# Patient Record
Sex: Female | Born: 1990 | Race: White | Hispanic: No | Marital: Single | Smoking: Former smoker
Health system: Southern US, Community
[De-identification: ages and names within clinical notes are randomized; demographics above are authoritative.]

## PROBLEM LIST (undated history)

## (undated) DIAGNOSIS — R519 Headache, unspecified: Secondary | ICD-10-CM

## (undated) HISTORY — PX: SHOULDER SURGERY: SHX246

---
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
## 2014-09-22 ENCOUNTER — Ambulatory Visit: Payer: Self-pay

## 2014-09-22 LAB — URINALYSIS, COMPLETE
Bilirubin,UR: NEGATIVE
Blood: NEGATIVE
GLUCOSE, UR: NEGATIVE
KETONE: NEGATIVE
Nitrite: NEGATIVE
Ph: 6 (ref 5.0–8.0)
Protein: NEGATIVE
SPECIFIC GRAVITY: 1.025 (ref 1.000–1.030)

## 2014-09-24 LAB — URINE CULTURE

## 2015-04-16 DIAGNOSIS — O99820 Streptococcus B carrier state complicating pregnancy: Secondary | ICD-10-CM | POA: Insufficient documentation

## 2015-05-02 DIAGNOSIS — R030 Elevated blood-pressure reading, without diagnosis of hypertension: Secondary | ICD-10-CM | POA: Insufficient documentation

## 2017-10-02 ENCOUNTER — Encounter: Payer: Self-pay | Admitting: *Deleted

## 2017-10-02 ENCOUNTER — Other Ambulatory Visit: Payer: Self-pay

## 2017-10-02 DIAGNOSIS — Z7722 Contact with and (suspected) exposure to environmental tobacco smoke (acute) (chronic): Secondary | ICD-10-CM | POA: Diagnosis not present

## 2017-10-02 DIAGNOSIS — J029 Acute pharyngitis, unspecified: Secondary | ICD-10-CM | POA: Insufficient documentation

## 2017-10-02 NOTE — ED Triage Notes (Signed)
Pt has sore throat for 1.5 weeks.  Pt taking motrin with some relief.  Pt alert.

## 2017-10-03 ENCOUNTER — Emergency Department
Admission: EM | Admit: 2017-10-03 | Discharge: 2017-10-03 | Disposition: A | Discharge status: Home / Self Care | Payer: Medicaid Other | Attending: Emergency Medicine | Admitting: Emergency Medicine

## 2017-10-03 ENCOUNTER — Encounter: Payer: Self-pay | Admitting: Emergency Medicine

## 2017-10-03 DIAGNOSIS — J029 Acute pharyngitis, unspecified: Secondary | ICD-10-CM

## 2017-10-03 LAB — GROUP A STREP BY PCR: Group A Strep by PCR: NOT DETECTED

## 2017-10-03 MED ORDER — DEXAMETHASONE 1 MG/ML PO CONC
10.0000 mg | Freq: Once | ORAL | Status: AC
  Administered 2017-10-03: 10 mg via ORAL
  Filled 2017-10-03: qty 10

## 2017-10-03 MED ORDER — TRAMADOL HCL 50 MG PO TABS: 50.0000 mg | ORAL_TABLET | Freq: Four times a day (QID) | ORAL | 0 refills | Status: DC | PRN

## 2017-10-03 NOTE — Discharge Instructions (Signed)
Please follow up with your primary care physician for further evaluation. Please continue taking ibuprofen and tylenol as needed

## 2017-10-03 NOTE — ED Notes (Signed)
Pt signed esignature. 

## 2017-10-03 NOTE — ED Provider Notes (Signed)
[facility] Emergency Department Provider Note   ____________________________________________   First MD Initiated Contact with Patient 10/03/17 [phone]     (approximate)  I have reviewed the triage vital signs and the nursing notes.   HISTORY  Chief Complaint Sore Throat    HPI Denise Small is a 27 y.o. female who comes into the hospital today with some sore throat.  The patient states that she feels like a tennis ball is in her throat.  She states that her mother is engaged to a Surveyor, miningphysician's assistant and was told that she should come in and get checked out because she might have strep throat or epiglottitis.  She states that she did not want the swelling to get so bad that she could not breathe overnight.  She reports that she has been taking ibuprofen to help with any inflammation.  She states that her pain is a 4 out of 10 in intensity but it is worse whenever she tries to swallow.  She states that it got really bad yesterday.  She had a fever about 3 or 4 days ago but reports that she has not had anything since then.  She has been battling a bad cold and reports that she is been having a cough and sometimes has some posttussive emesis.  The patient decided to come into the hospital today for evaluation.  She has been taking Tylenol and ibuprofen at home.   History reviewed. No pertinent past medical history.  There are no active problems to display for this patient.   Past Surgical History:  Procedure Laterality Date  . SHOULDER SURGERY      Prior to Admission medications   Medication Sig Start Date End Date Taking? Authorizing Provider  traMADol (ULTRAM) 50 MG tablet Take 1 tablet (50 mg total) by mouth every 6 (six) hours as needed. 10/03/17   Rebecka ApleyWebster, Geneal Huebert P, MD    Allergies Sulfa antibiotics  No family history on file.  Social History Social History   Tobacco Use  . Smoking status: Passive Smoke Exposure - Never Smoker  .  Smokeless tobacco: Never Used  Substance Use Topics  . Alcohol use: No    Frequency: Never  . Drug use: No    Review of Systems  Constitutional: fever Eyes: No visual changes. ENT:  sore throat. Cardiovascular: Denies chest pain. Respiratory: cough and shortness of breath. Gastrointestinal: No abdominal pain.  No nausea, no vomiting.  No diarrhea.  No constipation. Genitourinary: Negative for dysuria. Musculoskeletal: Negative for back pain. Skin: Negative for rash. Neurological: Negative for headaches, focal weakness or numbness.   ____________________________________________   PHYSICAL EXAM:  VITAL SIGNS: ED Triage Vitals  Enc Vitals Group     BP 10/02/17 2145 127/67     Pulse Rate 10/02/17 2145 85     Resp 10/02/17 2145 18     Temp 10/02/17 2145 98.6 F (37 C)     Temp Source 10/02/17 2145 Oral     SpO2 10/02/17 2145 99 %     Weight 10/02/17 2146 250 lb (113.4 kg)     Height 10/02/17 2146 6' (1.829 m)     Head Circumference --      Peak Flow --      Pain Score 10/02/17 2144 4     Pain Loc --      Pain Edu? --      Excl. in GC? --     Constitutional: Alert and oriented. Well appearing and  in mild distress. Eyes: Conjunctivae are normal. PERRL. EOMI. Head: Atraumatic. Nose: No congestion/rhinnorhea. Mouth/Throat: Mucous membranes are moist.  Oropharynx non-erythematous.  There is some drainage and purulence to the patient's tonsils bilaterally Hematological/Lymphatic/Immunilogical:  cervical lymphadenopathy. Cardiovascular: Normal rate, regular rhythm. Grossly normal heart sounds.  Good peripheral circulation. Respiratory: Normal respiratory effort.  No retractions. Lungs CTAB. Gastrointestinal: Soft and nontender. No distention.  Musculoskeletal: No lower extremity tenderness nor edema.  Neurologic:  Normal speech and language.  Skin:  Skin is warm, dry and intact.  Psychiatric: Mood and affect are normal.   ____________________________________________     LABS (all labs ordered are listed, but only abnormal results are displayed)  Labs Reviewed  GROUP A STREP BY PCR   ____________________________________________  EKG  none ____________________________________________  RADIOLOGY  No results found.  ____________________________________________   PROCEDURES  Procedure(s) performed: None  Procedures  Critical Care performed: No  ____________________________________________   INITIAL IMPRESSION / ASSESSMENT AND PLAN / ED COURSE  As part of my medical decision making, I reviewed the following data within the electronic MEDICAL RECORD NUMBER Notes from prior ED visits and [place] Controlled Substance Database   This is a 27 year old female who comes into the hospital today with some sore throat.  She feels like something is stuck in her throat.  My differential diagnosis includes pharyngitis, strep throat, peritonsillar abscess.  The patient does not have any stridor and is breathing comfortably.  She is not drooling so epiglottitis is lower on my list.  Give the patient a dose of dexamethasone.  I will also check a strep.  The patient will be reassessed once I received the results of her strep.    The patient strep is negative.  She will be discharged home to follow-up with her primary care physician.  ____________________________________________   FINAL CLINICAL IMPRESSION(S) / ED DIAGNOSES  Final diagnoses:  Pharyngitis, unspecified etiology  Sore throat     ED Discharge Orders        Ordered    traMADol (ULTRAM) 50 MG tablet  Every 6 hours PRN     10/03/17 0157       Note:  This document was prepared using Dragon voice recognition software and may include unintentional dictation errors.    Rebecka Apley, MD 10/03/17 [phone]

## 2018-10-31 ENCOUNTER — Encounter: Payer: Self-pay | Admitting: Orthopaedic Surgery

## 2018-10-31 ENCOUNTER — Ambulatory Visit: Payer: Medicaid Other | Admitting: Orthopaedic Surgery

## 2018-10-31 ENCOUNTER — Ambulatory Visit (INDEPENDENT_AMBULATORY_CARE_PROVIDER_SITE_OTHER): Payer: Medicaid Other

## 2018-10-31 VITALS — BP 128/85 | HR 77 | Ht 72.0 in | Wt 242.0 lb

## 2018-10-31 DIAGNOSIS — G8929 Other chronic pain: Secondary | ICD-10-CM

## 2018-10-31 DIAGNOSIS — M25512 Pain in left shoulder: Secondary | ICD-10-CM

## 2018-10-31 MED ORDER — NAPROXEN 500 MG PO TABS: 500.0000 mg | ORAL_TABLET | Freq: Two times a day (BID) | ORAL | 5 refills | Status: DC

## 2018-10-31 NOTE — Patient Instructions (Signed)
..  Your MRI has been ordered.  We will contact your insurance company for approval. After the authorization is received the MRI and a return appointment will be scheduled with you by phone.  If you do not hear from us within 5 business days please call [phone] and ask for the pre-authorization representative in our office.  
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
## 2018-11-09 ENCOUNTER — Ambulatory Visit (HOSPITAL_COMMUNITY)
Admission: RE | Admit: 2018-11-09 | Discharge: 2018-11-09 | Disposition: A | Discharge status: Home / Self Care | Payer: Medicaid Other | Source: Ambulatory Visit | Attending: Orthopaedic Surgery | Admitting: Orthopaedic Surgery

## 2018-11-09 DIAGNOSIS — G8929 Other chronic pain: Secondary | ICD-10-CM | POA: Diagnosis present

## 2018-11-09 DIAGNOSIS — M25512 Pain in left shoulder: Secondary | ICD-10-CM | POA: Insufficient documentation

## 2018-11-13 ENCOUNTER — Ambulatory Visit: Payer: Medicaid Other | Admitting: Orthopaedic Surgery

## 2018-11-13 ENCOUNTER — Telehealth: Payer: Self-pay | Admitting: Orthopaedic Surgery

## 2018-11-13 ENCOUNTER — Encounter: Payer: Self-pay | Admitting: Orthopaedic Surgery

## 2018-11-13 VITALS — BP 129/76 | HR 72 | Ht 72.0 in | Wt 243.0 lb

## 2018-11-13 DIAGNOSIS — M25512 Pain in left shoulder: Secondary | ICD-10-CM

## 2018-11-13 DIAGNOSIS — G8929 Other chronic pain: Secondary | ICD-10-CM

## 2018-11-13 NOTE — Telephone Encounter (Signed)
Drenda Freeze from [facility] [phone], Option 1, then Option 3 - called to verify if patient's imaging is for MRI with and without contrast or is it an arthrogram?

## 2018-11-13 NOTE — Addendum Note (Signed)
Addended by: Baird Kay on: 11/13/2018 04:05 PM   Modules accepted: Orders

## 2018-11-13 NOTE — Progress Notes (Signed)
Patient IH:[id number] Denise Small, female DOB:03/24/1991, 28 y.o. [id number]  Chief Complaint  Patient presents with  . Shoulder Pain    left  . Results    review MRI scan left shoulder     HPI  Denise Small is a 28 y.o. female who has continued pain in the left shoulder.  She had a MRI which showed: IMPRESSION: 1. 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 This may be better evaluated with dedicated MR arthrography.  I explained the findings to her.  She would like to get the arthrogram and then, if surgery needed, proceed with that.  I will arrange the new study.   Body mass index is 32.96 kg/m.  ROS  Review of Systems  Constitutional: Positive for activity change.  Musculoskeletal: Positive for arthralgias.  All other systems reviewed and are negative.   All other systems reviewed and are negative.  The following is a summary of the past history medically, past history surgically, known current medicines, social history and family history.  This information is gathered electronically by the computer from prior information and documentation.  I review this each visit and have found including this information at this point in the chart is beneficial and informative.    History reviewed. No pertinent past medical history.  Past Surgical History:  Procedure Laterality Date  . SHOULDER SURGERY      Family History  Problem Relation Age of Onset  . Arthritis Mother   . Arthritis Father     Social History Social History   Tobacco Use  . Smoking status: Passive Smoke Exposure - Never Smoker  . Smokeless tobacco: Never Used  Substance Use Topics  . Alcohol use: No    Frequency: Never  . Drug use: No    Allergies  Allergen Reactions  . Sulfa Antibiotics Rash    Current Outpatient Medications  Medication Sig Dispense Refill  . naproxen (NAPROSYN) 500 MG tablet Take 1 tablet (500 mg total) by  mouth 2 (two) times daily with a meal. 60 tablet 5  . Norethindrone-Ethinyl Estradiol-Fe (GENERESS FE) 0.8-25 MG-MCG tablet Chew by mouth.    . traMADol (ULTRAM) 50 MG tablet Take 1 tablet (50 mg total) by mouth every 6 (six) hours as needed. (Patient not taking: Reported on 11/13/2018) 12 tablet 0   No current facility-administered medications for this visit.      Physical Exam  Blood pressure 129/76, pulse 72, height 6' (1.829 m), weight 243 lb (110.2 kg).  Constitutional: overall normal hygiene, normal nutrition, well developed, normal grooming, normal body habitus. Assistive device:none  Musculoskeletal: gait and station Limp none, muscle tone and strength are normal, no tremors or atrophy is present.  .  Neurological: coordination overall normal.  Deep tendon reflex/nerve stretch intact.  Sensation normal.  Cranial nerves II-XII intact.   Skin:   Normal overall no scars, lesions, ulcers or rashes. No psoriasis.  Psychiatric: Alert and oriented x 3.  Recent memory intact, remote memory unclear.  Normal mood and affect. Well groomed.  Good eye contact.  Cardiovascular: overall no swelling, no varicosities, no edema bilaterally, normal temperatures of the legs and arms, no clubbing, cyanosis and good capillary refill.  Lymphatic: palpation is normal.  She has painful ROM of the right shoulder, more in the extremes.  NV intact. All other systems reviewed and are negative   The patient has been educated about the nature of the problem(s) and counseled on treatment options.  The patient appeared to understand what I have discussed and is in agreement with it.  Encounter Diagnosis  Name Primary?  . Chronic left shoulder pain Yes    PLAN Call if any problems.  Precautions discussed.  Continue current medications.   Return to clinic get MRI arthrogram of the left shoulder.   Electronically Signed Darreld Mclean, MD 2/25/202010:47 AM

## 2018-11-23 ENCOUNTER — Ambulatory Visit
Admission: RE | Admit: 2018-11-23 | Discharge: 2018-11-23 | Disposition: A | Discharge status: Home / Self Care | Payer: Medicaid Other | Source: Ambulatory Visit | Attending: Orthopaedic Surgery | Admitting: Orthopaedic Surgery

## 2018-11-23 DIAGNOSIS — M25512 Pain in left shoulder: Principal | ICD-10-CM

## 2018-11-23 DIAGNOSIS — G8929 Other chronic pain: Secondary | ICD-10-CM

## 2018-11-23 MED ORDER — IOPAMIDOL (ISOVUE-M 200) INJECTION 41%
20.0000 mL | Freq: Once | INTRAMUSCULAR | Status: AC
  Administered 2018-11-23: 20 mL via INTRA_ARTICULAR

## 2018-11-29 ENCOUNTER — Other Ambulatory Visit: Payer: Self-pay

## 2018-11-29 ENCOUNTER — Ambulatory Visit (INDEPENDENT_AMBULATORY_CARE_PROVIDER_SITE_OTHER): Payer: Medicaid Other | Admitting: Orthopaedic Surgery

## 2018-11-29 ENCOUNTER — Other Ambulatory Visit: Payer: Medicaid Other

## 2018-11-29 ENCOUNTER — Encounter: Payer: Self-pay | Admitting: Orthopaedic Surgery

## 2018-11-29 VITALS — BP 122/71 | HR 61 | Ht 72.0 in | Wt 238.0 lb

## 2018-11-29 DIAGNOSIS — G8929 Other chronic pain: Secondary | ICD-10-CM

## 2018-11-29 DIAGNOSIS — M25512 Pain in left shoulder: Secondary | ICD-10-CM

## 2018-11-29 NOTE — Progress Notes (Signed)
Patient ON:GEXBMW Denise Small, female DOB:Apr 01, 1991, 28 y.o. UXL:[id number]  Chief Complaint  Patient presents with  . Shoulder Pain    left     HPI  Denise Small is a 28 y.o. female who has left shoulder pain.  She had a MRI and an arthrogram was recommended of the shoulder. She had that done and it showed: IMPRESSION: 1. Prior anterior labral repair. No recurrent labral tear. The patient has a Buford complex and the fluid signal seen on prior MRI corresponded to normal joint fluid undercutting a thickened middle glenohumeral ligament.    Body mass index is 32.28 kg/m.  ROS  Review of Systems  Constitutional: Positive for activity change.  Musculoskeletal: Positive for arthralgias.  All other systems reviewed and are negative.   All other systems reviewed and are negative.  The following is a summary of the past history medically, past history surgically, known current medicines, social history and family history.  This information is gathered electronically by the computer from prior information and documentation.  I review this each visit and have found including this information at this point in the chart is beneficial and informative.    History reviewed. No pertinent past medical history.  Past Surgical History:  Procedure Laterality Date  . SHOULDER SURGERY      Family History  Problem Relation Age of Onset  . Arthritis Mother   . Arthritis Father     Social History Social History   Tobacco Use  . Smoking status: Passive Smoke Exposure - Never Smoker  . Smokeless tobacco: Never Used  Substance Use Topics  . Alcohol use: No    Frequency: Never  . Drug use: No    Allergies  Allergen Reactions  . Sulfa Antibiotics Rash    Current Outpatient Medications  Medication Sig Dispense Refill  . naproxen (NAPROSYN) 500 MG tablet Take 1 tablet (500 mg total) by mouth 2 (two) times daily with a meal. 60 tablet 5  . Norethindrone-Ethinyl  Estradiol-Fe (GENERESS FE) 0.8-25 MG-MCG tablet Chew by mouth.    . traMADol (ULTRAM) 50 MG tablet Take 1 tablet (50 mg total) by mouth every 6 (six) hours as needed. (Patient not taking: Reported on 11/13/2018) 12 tablet 0   No current facility-administered medications for this visit.      Physical Exam  Blood pressure 122/71, pulse 61, height 6' (1.829 m), weight 238 lb (108 kg), last menstrual period 11/04/2018.  Constitutional: overall normal hygiene, normal nutrition, well developed, normal grooming, normal body habitus. Assistive device:none  Musculoskeletal: gait and station Limp none, muscle tone and strength are normal, no tremors or atrophy is present.  .  Neurological: coordination overall normal.  Deep tendon reflex/nerve stretch intact.  Sensation normal.  Cranial nerves II-XII intact.   Skin:   Normal overall no scars, lesions, ulcers or rashes. No psoriasis.  Psychiatric: Alert and oriented x 3.  Recent memory intact, remote memory unclear.  Normal mood and affect. Well groomed.  Good eye contact.  Cardiovascular: overall no swelling, no varicosities, no edema bilaterally, normal temperatures of the legs and arms, no clubbing, cyanosis and good capillary refill.  Lymphatic: palpation is normal.  Left shoulder has near full motion but painful in the extremes.  All other systems reviewed and are negative   The patient has been educated about the nature of the problem(s) and counseled on treatment options.  The patient appeared to understand what I have discussed and is in agreement with it.  Encounter Diagnosis  Name Primary?  . Chronic left shoulder pain Yes    PLAN Call if any problems.  Precautions discussed.  Continue current medications.   Return to clinic 1 month   Do exercises. Go by PT to get exercises also.  Electronically Signed Darreld Mclean, MD 3/12/202010:08 AM

## 2018-11-29 NOTE — Addendum Note (Signed)
Addended by: Baird Kay on: 11/29/2018 10:26 AM   Modules accepted: Orders

## 2018-12-03 ENCOUNTER — Telehealth (HOSPITAL_COMMUNITY): Payer: Self-pay | Admitting: Medical Oncology

## 2018-12-03 NOTE — Telephone Encounter (Signed)
12/03/18  Spoke with patient about changing appt on 3/18 with Beth to either 9:00 or 2:30.  She said that she would have to talk to her boss and call us back.

## 2018-12-05 ENCOUNTER — Ambulatory Visit (HOSPITAL_COMMUNITY): Payer: Medicaid Other | Attending: Orthopaedic Surgery | Admitting: Specialist

## 2018-12-05 ENCOUNTER — Encounter (HOSPITAL_COMMUNITY): Payer: Self-pay | Admitting: Specialist

## 2018-12-05 ENCOUNTER — Other Ambulatory Visit: Payer: Self-pay

## 2018-12-05 DIAGNOSIS — R29898 Other symptoms and signs involving the musculoskeletal system: Secondary | ICD-10-CM

## 2018-12-05 DIAGNOSIS — M25512 Pain in left shoulder: Secondary | ICD-10-CM | POA: Insufficient documentation

## 2018-12-05 DIAGNOSIS — G8929 Other chronic pain: Secondary | ICD-10-CM | POA: Diagnosis present

## 2018-12-05 DIAGNOSIS — M25612 Stiffness of left shoulder, not elsewhere classified: Secondary | ICD-10-CM | POA: Diagnosis present

## 2018-12-05 NOTE — Patient Instructions (Signed)

## 2018-12-05 NOTE — Therapy (Signed)
[facility] [address] [place], [place], [address] Phone: [phone]   Fax:  [phone]  Occupational Therapy Evaluation  Patient Details  Name: Denise Small MRN: [id number] Date of Birth: 29-Aug-1991 Referring Provider (OT): Dr. Darreld Mclean   Encounter Date: 12/05/2018  OT End of Session - 12/05/18 1547    Visit Number  1    Number of Visits  12    Date for OT Re-Evaluation  01/16/19   mini reassess on 4/8   Authorization Type  medicaid    Authorization Time Period  requested 3 visits from 12/06/18-12/24/2018    Authorization - Visit Number  0    Authorization - Number of Visits  3    OT Start Time  1430    OT Stop Time  1515    OT Time Calculation (min)  45 min    Activity Tolerance  Patient tolerated treatment well    Behavior During Therapy  [facility] for tasks assessed/performed       History reviewed. No pertinent past medical history.  Past Surgical History:  Procedure Laterality Date  . SHOULDER SURGERY      There were no vitals filed for this visit.  Subjective Assessment - 12/05/18 1543    Subjective   S:  I had surgery on my shoulder 7 years ago, it hasnt ever been right since.      Pertinent History  Denise Small reports ongoing left shoulder pain, which initially began 7 years ago with a dislocation that was never corrected.  She had surgery 7 years ago, and reports she never finished her therapy and has experienced pain ever since.  She has a 29 year old son and reports that she is having more and more pain and less mobilty in her left shoulder when she picks him up.  She consulted with Dr. Hilda Lias, had an arthrogram completed, which detected no new tears.  She has been referred to occupational therapy for evaluation and treatment for left shoulder pain.      Patient Stated Goals  I want to use my left arm like normal.    Currently in Pain?  Yes    Pain Score  5     Pain Location  Shoulder    Pain Orientation   Left    Pain Descriptors / Indicators  Aching    Pain Type  Acute pain    Pain Onset  More than a month ago    Pain Frequency  Constant    Aggravating Factors   use, movement    Pain Relieving Factors  nothing    Effect of Pain on Daily Activities  max        [facility] OT Assessment - 12/05/18 0001      Assessment   Medical Diagnosis  Left Shoulder Pain    Referring Provider (OT)  Dr. Darreld Mclean    Onset Date/Surgical Date  --   chronic   Hand Dominance  Right    Prior Therapy  7 years ago after surgery on left shoulder      Precautions   Precautions  None      Restrictions   Weight Bearing Restrictions  No      Balance Screen   Has the patient fallen in the past 6 months  No    Has the patient had a decrease in activity level because of a fear of falling?   No    Is the patient reluctant  to leave their home because of a fear of falling?   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  pain with reaching out to side, overhead, behind head, behind back, very painful when attempting to pick up her 59 year old son.       Written Expression   Dominant Hand  Right      Observation/Other Assessments   Focus on Therapeutic Outcomes (FOTO)   n/a      Posture/Postural Control   Posture/Postural Control  Postural limitations    Postural Limitations  Rounded Shoulders      Sensation   Light Touch  Appears Intact      Coordination   Gross Motor Movements are Fluid and Coordinated  Yes    Fine Motor Movements are Fluid and Coordinated  Yes      ROM / Strength   AROM / PROM / Strength   AROM;PROM;Strength      Palpation   Palpation comment  moderate fascial restrictions in left scapular and shoulder region      AROM   Overall AROM Comments  assessed in seated.  external and internal rotation with shoulder adducted    AROM Assessment Site  Shoulder    Right/Left Shoulder  Left    Left Shoulder Flexion  110 Degrees    Left Shoulder ABduction  110 Degrees    Left Shoulder Internal Rotation  70 Degrees    Left Shoulder External Rotation  46 Degrees      PROM   Overall PROM Comments  assessed in supine, WFL      Strength   Overall Strength Comments  assessed in seated    Strength Assessment Site  Shoulder    Right/Left Shoulder  Left    Left Shoulder Flexion  4+/5    Left Shoulder ABduction  4+/5    Left Shoulder Internal Rotation  4+/5    Left Shoulder External Rotation  4+/5               OT Treatments/Exercises (OP) - 12/05/18 0001      Manual Therapy   Manual Therapy  Myofascial release    Manual therapy comments  manual therapy completed seperately than all other intervention this date of service    Myofascial Release  myofascial release and manual stretching to left upper arm, scapular, and shoulder region to decrease pain and restrictions and improve pain free mobilty in left shoulder region.            OT Education - 12/05/18 1546    Education Details  educated patient on shoulder stretches to improve pain free mobility and strength    Person(s) Educated  Patient    Methods  Explanation;Demonstration;Handout    Comprehension  Verbalized understanding;Returned demonstration       OT Short Term Goals - 12/05/18 1557      OT SHORT TERM GOAL #1   Title  Patient will be independent with HEP for improved mobility  and strength in left shoulder needed for daily activity completion.     Time  6    Period  Weeks    Status  New    Target Date  01/16/19      OT SHORT TERM GOAL #2   Title  Patient will improve left shoulder A/ROM and P/ROM  to WNL for improved ability to reach into overhead cabinets.    Time  6    Period  Weeks    Status  New      OT SHORT TERM GOAL #3   Title  Patient will improve left shoulder strength to 5/5 for improved ability to pick up and carry her son.    Time  6    Period  Weeks    Status  New      OT SHORT TERM GOAL #4   Title  Patient will improve left shoulder pain to a 2/10 or better when completing daily activities.     Time  6    Period  Weeks    Status  New      OT SHORT TERM GOAL #5   Title  Patient will decrease fascial restrictions in her left shoulder region from moderate to minimal or better for improved pain free mobility required for ADL completion.      Time  6    Period  Weeks    Status  New      Additional Short Term Goals   Additional Short Term Goals  Yes      OT SHORT TERM GOAL #6   Title  Patient will improve posture from fair + to good for less pain in left shoulder region when completing dialy activities.     Time  6    Period  Weeks    Status  New               Plan - 12/05/18 1548    Clinical Impression Statement  A:  Patient is a 28 year old female with chronic left shoulder pain due to surgically repaired left labral tear 7 years ago.  As her son has gotten bigger (age 64), she has been experiencing increased pain with range of motion and and strength activities with her left shoulder.  Patient reports that unless she keeps her left arm to her side, she experiences pain with all functional activity.      OT Occupational Profile and History  Problem Focused Assessment - Including review of records relating to presenting problem    Occupational performance deficits (Please refer to evaluation for details):  ADL's;IADL's;Work;Leisure    Body Structure / Function / Physical Skills  ADL;Muscle spasms;ROM;Strength;IADL;Pain;UE functional use;Fascial restriction    Rehab Potential  Good    Clinical Decision Making  Limited treatment options, no task  modification necessary    Comorbidities Affecting Occupational Performance:  None    Modification or Assistance to Complete Evaluation   No modification of tasks or assist necessary to complete eval    OT Frequency  2x / week    OT Duration  6 weeks    OT Treatment/Interventions  Self-care/ADL training;Cryotherapy;Therapeutic exercise;DME and/or AE instruction;Manual Therapy;Neuromuscular education;Ultrasound;Electrical Stimulation;Energy conservation;Therapeutic activities;Passive range of motion;Patient/family education    Plan  P: SKilled OT intervention 2 times a week for 6 weeks to decrease pain and fascial restrictions and improve pain free mobility and strenght in her left shoulder while completing b/ials, work, leisure activities, and caring for her 46 year old son.  Next session:  begin manual therapy, a/rom, progress as tolerated.  add tband exercises to hep.         Patient will benefit from skilled therapeutic intervention in order to improve the following deficits and impairments:  Body Structure / Function / Physical Skills  Visit Diagnosis: Chronic left shoulder pain - Plan: Ot plan of care cert/re-cert  Stiffness of left shoulder, not elsewhere classified - Plan: Ot plan of care cert/re-cert  Other symptoms and signs involving the musculoskeletal system - Plan: Ot plan of care cert/re-cert    Problem List Patient Active Problem List   Diagnosis Date Noted  . Postpartum care following vaginal delivery 05/04/2015  . Elevated blood pressure reading 05/02/2015  . Group B Streptococcus carrier, antepartum 04/16/2015  . Vaginal delivery 11/26/2014    Shirlean Mylar, MHA, OTR/L [phone]  12/05/2018, 4:10 PM  [facility] [address] [place], [place], [address] Phone: [phone]   Fax:  [phone]  Name: Denise Small MRN: [id number] Date of Birth: [birth year]

## 2018-12-07 ENCOUNTER — Telehealth (HOSPITAL_COMMUNITY): Payer: Self-pay | Admitting: Occupational Therapy

## 2018-12-07 NOTE — Telephone Encounter (Signed)
Called and left message for pt regarding 2 week clinic closure for COVID-19 precautions. Asked pt to return call for HEP update and rescheduling appts.   Ezra Sites, OTR/L  [phone] 12/07/2018

## 2018-12-10 ENCOUNTER — Telehealth (HOSPITAL_COMMUNITY): Payer: Self-pay

## 2018-12-10 NOTE — Telephone Encounter (Signed)
Spoke with patient regarding 2 week closure. Rescheduled appointment for 12/24/18. E-mail provided to send updated HEP.   Limmie Patricia, OTR/L,CBIS  [phone];

## 2018-12-12 ENCOUNTER — Encounter (HOSPITAL_COMMUNITY): Payer: Medicaid Other

## 2018-12-18 ENCOUNTER — Encounter

## 2018-12-21 ENCOUNTER — Telehealth (HOSPITAL_COMMUNITY): Payer: Self-pay | Admitting: Occupational Therapy

## 2018-12-21 NOTE — Telephone Encounter (Signed)
Patient was contacted today regarding the temporary reduction of OP rehab services due to concerns for community transmission of Covid-19.  Therapist advised the patient to continue to perform their HEP and assured they had no unanswered questions at this time.   Will call to reschedule once clinic has reopened.    Ezra Sites, OTR/L  [phone] 12/21/2018

## 2018-12-24 ENCOUNTER — Ambulatory Visit (HOSPITAL_COMMUNITY): Payer: Medicaid Other

## 2018-12-26 ENCOUNTER — Other Ambulatory Visit: Payer: Self-pay

## 2018-12-26 ENCOUNTER — Ambulatory Visit: Payer: Medicaid Other | Admitting: Orthopaedic Surgery

## 2018-12-26 ENCOUNTER — Encounter: Payer: Self-pay | Admitting: Orthopaedic Surgery

## 2018-12-26 VITALS — BP 125/82 | HR 70 | Ht 72.0 in | Wt 244.0 lb

## 2018-12-26 DIAGNOSIS — M25512 Pain in left shoulder: Secondary | ICD-10-CM | POA: Diagnosis not present

## 2018-12-26 DIAGNOSIS — G8929 Other chronic pain: Secondary | ICD-10-CM | POA: Diagnosis not present

## 2018-12-26 NOTE — Progress Notes (Signed)
Patient ZO:XWRUEA:Denise Small, female DOB:[birth year], 28 y.o. VWU:[id number]  Chief Complaint  Patient presents with  . Shoulder Pain    HPI  Denise Small is a 28 y.o. female who has chronic shoulder pain left.  She has had prior surgery on the shoulder and recently had a MRI with arthrogram.  She has been doing exercises as recommended by OT but OT is closed secondary to the COVID-19 problem.  She says the pain is unchanged.  She has no new trauma, she has no numbness.  The Naprosyn did not help.  I will have her see Dr. August Saucerean about this.  She understands there may be a delay because of the COVID-19 problem.   Body mass index is 33.09 kg/m.  ROS  Review of Systems  Constitutional: Positive for activity change.  Musculoskeletal: Positive for arthralgias.  All other systems reviewed and are negative.   All other systems reviewed and are negative.  The following is a summary of the past history medically, past history surgically, known current medicines, social history and family history.  This information is gathered electronically by the computer from prior information and documentation.  I review this each visit and have found including this information at this point in the chart is beneficial and informative.    History reviewed. No pertinent past medical history.  Past Surgical History:  Procedure Laterality Date  . SHOULDER SURGERY      Family History  Problem Relation Age of Onset  . Arthritis Mother   . Arthritis Father     Social History Social History   Tobacco Use  . Smoking status: Passive Smoke Exposure - Never Smoker  . Smokeless tobacco: Never Used  Substance Use Topics  . Alcohol use: No    Frequency: Never  . Drug use: No    Allergies  Allergen Reactions  . Sulfa Antibiotics Rash    Current Outpatient Medications  Medication Sig Dispense Refill  . naproxen (NAPROSYN) 500 MG tablet Take 1 tablet (500 mg total) by mouth 2 (two)  times daily with a meal. 60 tablet 5  . Norethindrone-Ethinyl Estradiol-Fe (GENERESS FE) 0.8-25 MG-MCG tablet Chew by mouth.    . traMADol (ULTRAM) 50 MG tablet Take 1 tablet (50 mg total) by mouth every 6 (six) hours as needed. (Patient not taking: Reported on 11/13/2018) 12 tablet 0   No current facility-administered medications for this visit.      Physical Exam  Blood pressure 125/82, pulse 70, height 6' (1.829 m), weight 244 lb (110.7 kg).  Constitutional: overall normal hygiene, normal nutrition, well developed, normal grooming, normal body habitus. Assistive device:none  Musculoskeletal: gait and station Limp none, muscle tone and strength are normal, no tremors or atrophy is present.  .  Neurological: coordination overall normal.  Deep tendon reflex/nerve stretch intact.  Sensation normal.  Cranial nerves II-XII intact.   Skin:   Normal overall no scars, lesions, ulcers or rashes. No psoriasis.  Psychiatric: Alert and oriented x 3.  Recent memory intact, remote memory unclear.  Normal mood and affect. Well groomed.  Good eye contact.  Cardiovascular: overall no swelling, no varicosities, no edema bilaterally, normal temperatures of the legs and arms, no clubbing, cyanosis and good capillary refill.  Lymphatic: palpation is normal.  Left shoulder is painful and tender in the extremes but has a good motion.  NV intact.  All other systems reviewed and are negative   The patient has been educated about the nature of the problem(s) and  counseled on treatment options.  The patient appeared to understand what I have discussed and is in agreement with it.  Encounter Diagnosis  Name Primary?  . Chronic left shoulder pain Yes    PLAN Call if any problems.  Precautions discussed.  Continue current medications.   Return to clinic to see Dr. August Saucer   Electronically Signed Darreld Mclean, MD 4/8/20209:25 AM

## 2018-12-27 ENCOUNTER — Ambulatory Visit: Payer: Medicaid Other | Admitting: Orthopaedic Surgery

## 2019-01-02 ENCOUNTER — Other Ambulatory Visit: Payer: Self-pay

## 2019-01-02 ENCOUNTER — Encounter (INDEPENDENT_AMBULATORY_CARE_PROVIDER_SITE_OTHER): Payer: Self-pay | Admitting: Orthopedic Surgery

## 2019-01-02 ENCOUNTER — Ambulatory Visit (INDEPENDENT_AMBULATORY_CARE_PROVIDER_SITE_OTHER): Payer: Medicaid Other | Admitting: Orthopedic Surgery

## 2019-01-02 DIAGNOSIS — M25512 Pain in left shoulder: Secondary | ICD-10-CM

## 2019-01-02 DIAGNOSIS — G8929 Other chronic pain: Secondary | ICD-10-CM | POA: Diagnosis not present
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
## 2019-01-16 ENCOUNTER — Telehealth (INDEPENDENT_AMBULATORY_CARE_PROVIDER_SITE_OTHER): Payer: Self-pay | Admitting: Orthopedic Surgery

## 2019-01-16 NOTE — Telephone Encounter (Signed)
Pt called stating she has the papers Dr August Saucer requested her to get.  Pt would like to know if she needs to drop them off or make an apt to be seen.

## 2019-01-16 NOTE — Telephone Encounter (Signed)
IC s/w patient She will drop off medical records and MRI from previous surgery for Dr August Saucer to review and he will contact patient.

## 2019-01-25 ENCOUNTER — Encounter (HOSPITAL_COMMUNITY): Payer: Self-pay | Admitting: Occupational Therapy

## 2019-01-25 NOTE — Therapy (Signed)
[facility] [place], [place], [address] Phone: [phone]   Fax:  [phone]  Patient Details  Name: Denise Small MRN: [id number] Date of Birth: [birth year] Referring Provider:  No ref. provider found  Encounter Date: 01/25/2019  OCCUPATIONAL THERAPY DISCHARGE SUMMARY  Visits from Start of Care: 1  Current functional level related to goals / functional outcomes: Spoke with pt to follow up on shoulder functioning. Dr. Luna Glasgow has referred pt to Dr. Marlou Sa who is reviewing her records and determining if she is surgical candidate. We will discharge pt at this time as she has had a change in provider and is waiting to determine further procedures. Pt is aware she will need a new referral to return to therapy.    Remaining deficits: Pain, ROM and strength limitations   Education / Equipment: HEP Plan: Patient agrees to discharge.  Patient goals were not met. Patient is being discharged due to a change in medical status.  ?????       Guadelupe Sabin, OTR/L  [phone] 01/25/2019, 3:46 PM  [facility] [address] [place], [place], [address] Phone: [phone]   Fax:  [phone]

## 2019-01-28 ENCOUNTER — Encounter (INDEPENDENT_AMBULATORY_CARE_PROVIDER_SITE_OTHER): Payer: Self-pay | Admitting: Orthopedic Surgery

## 2019-01-28 NOTE — Progress Notes (Signed)
I just talk with Denise Small.  I reviewed her office notes op note and arthroscopic photos.  It appears that she has a anchor in the mid equator region of the glenoid anteriorly.  The rest of her glenohumeral articular surfaces appear intact.  She has had some popping in that shoulder since surgery.  The popping is becoming more severe over the past year but it is been present for the 7-year since surgery.  Pain is starting to radiate up into her neck.  On my exam last clinic visit I thought there was a difference in motion between the left and right shoulder.  She did have a fairly significant injury but I do not get a clear history of actual shoulder instability prior to her surgery.  The op note demonstrated some instability present but no real pathology of the anterior inferior glenohumeral labral complex.  On the operating surgeons preoperative assessment and MRI evaluation it appears that there was mostly concerned about posterior labral tear and possibly anterior labral tear.  I discussed with Denise Small at length about potential evaluation and treatment of this problem.  Primarily that would include at the least release of some of that middle glenohumeral ligament from the capsule attachment at the glenoid.  At the most if there is some labral pathology present superiorly we would do a biceps tenodesis.  Patient understands the risk and benefits associated with the surgical approach in which the water is somewhat muddy in terms of clear-cut indication.  I think based on her worsening symptoms that an arthroscopic evaluation is indicated particularly with the cytocide difference in motion.  Also patient has significant mechanical symptoms which could also indicate ongoing pathologic process.  Patient understands the risk and benefits.  We will plan for arthroscopic evaluation in the near future.  All questions answered.

## 2019-02-08 ENCOUNTER — Encounter (HOSPITAL_COMMUNITY): Payer: Self-pay | Admitting: *Deleted

## 2019-02-08 ENCOUNTER — Other Ambulatory Visit (HOSPITAL_COMMUNITY): Payer: Medicaid Other

## 2019-02-08 ENCOUNTER — Other Ambulatory Visit: Payer: Self-pay

## 2019-02-08 NOTE — Progress Notes (Signed)
Patient denies that she or her family has experienced any of the following: Cough Fever >100.4 Runny Nose Sore Throat Difficulty breathing/ shortness of breath Travel in past 14 days- no  Patient will try to get to [facility] to pick up Pre- Surgery Ensure.  Patient will call if she can. I instructed patient to not eat after midnight and she may have clear liquids until 4:30 AM.  We discussed what clear liquids are and patient verbalized understanding.  Patient will have COVID screening on the way to [facility] on Tuesday.

## 2019-02-12 ENCOUNTER — Other Ambulatory Visit (HOSPITAL_COMMUNITY)
Admission: RE | Admit: 2019-02-12 | Discharge: 2019-02-12 | Disposition: A | Discharge status: Home / Self Care | Payer: Medicaid Other | Source: Ambulatory Visit | Attending: Orthopedic Surgery | Admitting: Orthopedic Surgery

## 2019-02-12 ENCOUNTER — Ambulatory Visit (HOSPITAL_COMMUNITY)
Admission: RE | Admit: 2019-02-12 | Discharge: 2019-02-12 | Disposition: A | Discharge status: Home / Self Care | Payer: Medicaid Other | Attending: Orthopedic Surgery | Admitting: Orthopedic Surgery

## 2019-02-12 ENCOUNTER — Other Ambulatory Visit: Payer: Self-pay

## 2019-02-12 ENCOUNTER — Other Ambulatory Visit (HOSPITAL_COMMUNITY): Payer: Self-pay

## 2019-02-12 ENCOUNTER — Encounter (HOSPITAL_COMMUNITY): Payer: Self-pay | Admitting: *Deleted

## 2019-02-12 ENCOUNTER — Encounter (HOSPITAL_COMMUNITY)
Admission: RE | Disposition: A | Discharge status: Home / Self Care | Payer: Self-pay | Source: Home / Self Care | Attending: Orthopedic Surgery

## 2019-02-12 ENCOUNTER — Ambulatory Visit (HOSPITAL_COMMUNITY): Payer: Medicaid Other | Admitting: Certified Registered Nurse Anesthetist

## 2019-02-12 DIAGNOSIS — Z87891 Personal history of nicotine dependence: Secondary | ICD-10-CM | POA: Insufficient documentation

## 2019-02-12 DIAGNOSIS — E669 Obesity, unspecified: Secondary | ICD-10-CM | POA: Insufficient documentation

## 2019-02-12 DIAGNOSIS — S43432A Superior glenoid labrum lesion of left shoulder, initial encounter: Secondary | ICD-10-CM | POA: Diagnosis not present

## 2019-02-12 DIAGNOSIS — Z1159 Encounter for screening for other viral diseases: Secondary | ICD-10-CM | POA: Insufficient documentation

## 2019-02-12 DIAGNOSIS — S46119S Strain of muscle, fascia and tendon of long head of biceps, unspecified arm, sequela: Secondary | ICD-10-CM | POA: Diagnosis not present

## 2019-02-12 DIAGNOSIS — Z6833 Body mass index (BMI) 33.0-33.9, adult: Secondary | ICD-10-CM | POA: Insufficient documentation

## 2019-02-12 HISTORY — PX: SHOULDER ARTHROSCOPY WITH LABRAL REPAIR: SHX5691

## 2019-02-12 HISTORY — DX: Headache, unspecified: R51.9

## 2019-02-12 LAB — BASIC METABOLIC PANEL
Anion gap: 11 (ref 5–15)
BUN: 13 mg/dL (ref 6–20)
CO2: 22 mmol/L (ref 22–32)
Calcium: 9.2 mg/dL (ref 8.9–10.3)
Chloride: 106 mmol/L (ref 98–111)
Creatinine, Ser: 0.88 mg/dL (ref 0.44–1.00)
GFR calc Af Amer: >60 mL/min (ref >60)
GFR calc non Af Amer: >60 mL/min (ref >60)
Glucose, Bld: 82 mg/dL (ref 70–99)
Potassium: 4 mmol/L (ref 3.5–5.1)
Sodium: 139 mmol/L (ref 135–145)

## 2019-02-12 LAB — CBC
HCT: 39.9 % (ref 36.0–46.0)
Hemoglobin: 13 g/dL (ref 12.0–15.0)
MCH: 27.2 pg (ref 26.0–34.0)
MCHC: 32.6 g/dL (ref 30.0–36.0)
MCV: 83.5 fL (ref 80.0–100.0)
Platelets: 200 10*3/uL (ref 150–400)
RBC: 4.78 MIL/uL (ref 3.87–5.11)
RDW: 12.1 % (ref 11.5–15.5)
WBC: 5.6 10*3/uL (ref 4.0–10.5)
nRBC: 0 % (ref 0.0–0.2)

## 2019-02-12 LAB — POCT PREGNANCY, URINE: Preg Test, Ur: NEGATIVE

## 2019-02-12 LAB — SARS CORONAVIRUS 2 BY RT PCR (HOSPITAL ORDER, PERFORMED IN ~~LOC~~ HOSPITAL LAB): SARS Coronavirus 2: NEGATIVE

## 2019-02-12 SURGERY — ARTHROSCOPY, SHOULDER, WITH GLENOID LABRUM REPAIR
Anesthesia: General | Site: Shoulder | Laterality: Left

## 2019-02-12 MED ORDER — PROPOFOL 10 MG/ML IV BOLUS
INTRAVENOUS | Status: AC
  Filled 2019-02-12: qty 20

## 2019-02-12 MED ORDER — SUGAMMADEX SODIUM 200 MG/2ML IV SOLN: INTRAVENOUS | Status: DC | PRN

## 2019-02-12 MED ORDER — LIDOCAINE 2% (20 MG/ML) 5 ML SYRINGE
INTRAMUSCULAR | Status: AC
  Filled 2019-02-12: qty 5

## 2019-02-12 MED ORDER — DEXMEDETOMIDINE HCL IN NACL 200 MCG/50ML IV SOLN
INTRAVENOUS | Status: DC | PRN
  Administered 2019-02-12: 8 ug via INTRAVENOUS
  Administered 2019-02-12: 12 ug via INTRAVENOUS

## 2019-02-12 MED ORDER — LACTATED RINGERS IV SOLN
INTRAVENOUS | Status: DC | PRN
  Administered 2019-02-12: 11:00:00 via INTRAVENOUS

## 2019-02-12 MED ORDER — ONDANSETRON HCL 4 MG/2ML IJ SOLN
INTRAMUSCULAR | Status: AC
  Filled 2019-02-12: qty 2

## 2019-02-12 MED ORDER — 0.9 % SODIUM CHLORIDE (POUR BTL) OPTIME
TOPICAL | Status: DC | PRN
  Administered 2019-02-12: 1000 mL

## 2019-02-12 MED ORDER — CHLORHEXIDINE GLUCONATE 4 % EX LIQD: 60.0000 mL | Freq: Once | CUTANEOUS | Status: DC

## 2019-02-12 MED ORDER — MIDAZOLAM HCL 2 MG/2ML IJ SOLN
2.0000 mg | Freq: Once | INTRAMUSCULAR | Status: AC
  Administered 2019-02-12: 2 mg via INTRAVENOUS

## 2019-02-12 MED ORDER — PHENYLEPHRINE HCL-NACL 10-0.9 MG/250ML-% IV SOLN
INTRAVENOUS | Status: AC
  Filled 2019-02-12: qty 500

## 2019-02-12 MED ORDER — METHOCARBAMOL 500 MG PO TABS: 500.0000 mg | ORAL_TABLET | Freq: Three times a day (TID) | ORAL | 0 refills | Status: DC | PRN

## 2019-02-12 MED ORDER — MEPERIDINE HCL 25 MG/ML IJ SOLN: 6.2500 mg | INTRAMUSCULAR | Status: DC | PRN

## 2019-02-12 MED ORDER — SODIUM CHLORIDE 0.9 % IR SOLN
Status: DC | PRN
  Administered 2019-02-12 (×7): 3000 mL

## 2019-02-12 MED ORDER — LIDOCAINE 2% (20 MG/ML) 5 ML SYRINGE
INTRAMUSCULAR | Status: DC | PRN
  Administered 2019-02-12: 30 mg via INTRAVENOUS

## 2019-02-12 MED ORDER — LACTATED RINGERS IV SOLN
INTRAVENOUS | Status: DC
  Administered 2019-02-12: 1000 mL via INTRAVENOUS

## 2019-02-12 MED ORDER — SUGAMMADEX SODIUM 200 MG/2ML IV SOLN
INTRAVENOUS | Status: DC | PRN
  Administered 2019-02-12: 200 mg via INTRAVENOUS

## 2019-02-12 MED ORDER — EPHEDRINE SULFATE-NACL 50-0.9 MG/10ML-% IV SOSY
PREFILLED_SYRINGE | INTRAVENOUS | Status: DC | PRN
  Administered 2019-02-12: 5 mg via INTRAVENOUS

## 2019-02-12 MED ORDER — ROCURONIUM BROMIDE 10 MG/ML (PF) SYRINGE
PREFILLED_SYRINGE | INTRAVENOUS | Status: DC | PRN
  Administered 2019-02-12: 10 mg via INTRAVENOUS
  Administered 2019-02-12: 20 mg via INTRAVENOUS
  Administered 2019-02-12: 70 mg via INTRAVENOUS

## 2019-02-12 MED ORDER — MIDAZOLAM HCL 2 MG/2ML IJ SOLN
INTRAMUSCULAR | Status: AC
  Filled 2019-02-12: qty 2

## 2019-02-12 MED ORDER — BUPIVACAINE HCL (PF) 0.5 % IJ SOLN
INTRAMUSCULAR | Status: DC | PRN
  Administered 2019-02-12: 15 mL via PERINEURAL

## 2019-02-12 MED ORDER — SODIUM CHLORIDE 0.9 % IV SOLN
INTRAVENOUS | Status: DC | PRN
  Administered 2019-02-12: 25 ug/min via INTRAVENOUS

## 2019-02-12 MED ORDER — PROMETHAZINE HCL 25 MG/ML IJ SOLN: 6.2500 mg | INTRAMUSCULAR | Status: DC | PRN

## 2019-02-12 MED ORDER — CEFAZOLIN SODIUM-DEXTROSE 2-4 GM/100ML-% IV SOLN
INTRAVENOUS | Status: AC
  Filled 2019-02-12: qty 100

## 2019-02-12 MED ORDER — DEXAMETHASONE SODIUM PHOSPHATE 10 MG/ML IJ SOLN
INTRAMUSCULAR | Status: DC | PRN
  Administered 2019-02-12: 10 mg via INTRAVENOUS

## 2019-02-12 MED ORDER — BUPIVACAINE LIPOSOME 1.3 % IJ SUSP
INTRAMUSCULAR | Status: DC | PRN
  Administered 2019-02-12: 10 mL via PERINEURAL

## 2019-02-12 MED ORDER — DEXAMETHASONE SODIUM PHOSPHATE 10 MG/ML IJ SOLN
INTRAMUSCULAR | Status: AC
  Filled 2019-02-12: qty 1

## 2019-02-12 MED ORDER — SODIUM CHLORIDE (PF) 0.9 % IJ SOLN
INTRAMUSCULAR | Status: DC | PRN
  Administered 2019-02-12 (×2): 10 mL

## 2019-02-12 MED ORDER — FENTANYL CITRATE (PF) 100 MCG/2ML IJ SOLN
INTRAMUSCULAR | Status: AC
  Administered 2019-02-12: 100 ug via INTRAVENOUS
  Filled 2019-02-12: qty 2

## 2019-02-12 MED ORDER — MIDAZOLAM HCL 2 MG/2ML IJ SOLN: 0.5000 mg | Freq: Once | INTRAMUSCULAR | Status: DC | PRN

## 2019-02-12 MED ORDER — FENTANYL CITRATE (PF) 100 MCG/2ML IJ SOLN
100.0000 ug | Freq: Once | INTRAMUSCULAR | Status: AC
  Administered 2019-02-12: 100 ug via INTRAVENOUS

## 2019-02-12 MED ORDER — HYDROMORPHONE HCL 1 MG/ML IJ SOLN: 0.2500 mg | INTRAMUSCULAR | Status: DC | PRN

## 2019-02-12 MED ORDER — CEFAZOLIN SODIUM-DEXTROSE 2-4 GM/100ML-% IV SOLN
2.0000 g | INTRAVENOUS | Status: AC
  Administered 2019-02-12: 2 g via INTRAVENOUS

## 2019-02-12 MED ORDER — SCOPOLAMINE 1 MG/3DAYS TD PT72
1.0000 | MEDICATED_PATCH | Freq: Once | TRANSDERMAL | Status: DC
  Administered 2019-02-12: 1.5 mg via TRANSDERMAL
  Filled 2019-02-12: qty 1

## 2019-02-12 MED ORDER — OXYCODONE HCL 5 MG PO TABS: 5.0000 mg | ORAL_TABLET | ORAL | 0 refills | Status: DC | PRN

## 2019-02-12 MED ORDER — PROPOFOL 10 MG/ML IV BOLUS
INTRAVENOUS | Status: DC | PRN
  Administered 2019-02-12: 30 mg via INTRAVENOUS
  Administered 2019-02-12: 200 mg via INTRAVENOUS

## 2019-02-12 MED ORDER — BUPIVACAINE HCL (PF) 0.25 % IJ SOLN
INTRAMUSCULAR | Status: AC
  Filled 2019-02-12: qty 30

## 2019-02-12 MED ORDER — ONDANSETRON HCL 4 MG/2ML IJ SOLN
INTRAMUSCULAR | Status: DC | PRN
  Administered 2019-02-12: 4 mg via INTRAVENOUS

## 2019-02-12 MED ORDER — DEXMEDETOMIDINE HCL IN NACL 80 MCG/20ML IV SOLN
INTRAVENOUS | Status: AC
  Filled 2019-02-12: qty 20

## 2019-02-12 MED ORDER — MIDAZOLAM HCL 2 MG/2ML IJ SOLN
INTRAMUSCULAR | Status: AC
  Administered 2019-02-12: 2 mg via INTRAVENOUS
  Filled 2019-02-12: qty 2

## 2019-02-12 MED ORDER — FENTANYL CITRATE (PF) 250 MCG/5ML IJ SOLN
INTRAMUSCULAR | Status: AC
  Filled 2019-02-12: qty 5

## 2019-02-12 MED ORDER — MIDAZOLAM HCL 5 MG/5ML IJ SOLN
INTRAMUSCULAR | Status: DC | PRN
  Administered 2019-02-12: 2 mg via INTRAVENOUS

## 2019-02-12 SURGICAL SUPPLY — 69 items
ANCHOR SUT 1.8 FBRTK KNTLS 2SU (Anchor) ×2 IMPLANT
ANCHOR SUT SWIVELLOK BIO (Anchor) ×2 IMPLANT
BLADE CUTTER GATOR 3.5 (BLADE) IMPLANT
BLADE GREAT WHITE 4.2 (BLADE) ×1 IMPLANT
BLADE GREAT WHITE 4.2MM (BLADE) ×1
BLADE SURG 11 STRL SS (BLADE) IMPLANT
BUR OVAL 4.0 (BURR) ×2 IMPLANT
CANNULA 5.75X71 LONG (CANNULA) ×5 IMPLANT
CANNULA TWIST IN 8.25X7CM (CANNULA) ×5 IMPLANT
CLOSURE STERI-STRIP 1/2X4 (GAUZE/BANDAGES/DRESSINGS) ×1
CLSR STERI-STRIP ANTIMIC 1/2X4 (GAUZE/BANDAGES/DRESSINGS) ×1 IMPLANT
COVER SURGICAL LIGHT HANDLE (MISCELLANEOUS) ×3 IMPLANT
COVER WAND RF STERILE (DRAPES) ×3 IMPLANT
DISSECTOR 4.0MM X 13CM (MISCELLANEOUS) ×2 IMPLANT
DRAPE INCISE IOBAN 66X45 STRL (DRAPES) ×3 IMPLANT
DRAPE STERI 35X30 U-POUCH (DRAPES) ×6 IMPLANT
DRSG TEGADERM 4X4.75 (GAUZE/BANDAGES/DRESSINGS) ×6 IMPLANT
DRSG XEROFORM 1X8 (GAUZE/BANDAGES/DRESSINGS) ×2 IMPLANT
DURAPREP 26ML APPLICATOR (WOUND CARE) ×3 IMPLANT
ELECT REM PT RETURN 9FT ADLT (ELECTROSURGICAL) ×3
ELECTRODE REM PT RTRN 9FT ADLT (ELECTROSURGICAL) ×1 IMPLANT
FIBERSTICK 2 (SUTURE) IMPLANT
GAUZE SPONGE 4X4 12PLY STRL (GAUZE/BANDAGES/DRESSINGS) ×3 IMPLANT
GAUZE SPONGE 4X4 12PLY STRL LF (GAUZE/BANDAGES/DRESSINGS) ×2 IMPLANT
GAUZE XEROFORM 1X8 LF (GAUZE/BANDAGES/DRESSINGS) ×3 IMPLANT
GLOVE BIOGEL PI IND STRL 8 (GLOVE) ×1 IMPLANT
GLOVE BIOGEL PI INDICATOR 8 (GLOVE) ×2
GLOVE SURG ORTHO 8.0 STRL STRW (GLOVE) ×3 IMPLANT
GOWN STRL REUS W/ TWL LRG LVL3 (GOWN DISPOSABLE) ×3 IMPLANT
GOWN STRL REUS W/TWL LRG LVL3 (GOWN DISPOSABLE) ×6
KIT BASIN OR (CUSTOM PROCEDURE TRAY) ×3 IMPLANT
KIT BIO-TENODESIS 3X8 DISP (MISCELLANEOUS) ×2
KIT BIT DRILL 1.6 DISP (BIT) ×2 IMPLANT
KIT DISPOSABLE PUSHLOCK 2.9MM (KITS) IMPLANT
KIT INSRT BABSR STRL DISP BTN (MISCELLANEOUS) IMPLANT
KIT SPEAR STR 1.6MM DRILL (MISCELLANEOUS) ×2 IMPLANT
KIT TURNOVER KIT B (KITS) ×3 IMPLANT
LASSO 90 CVE QUICKPAS (DISPOSABLE) ×2 IMPLANT
LASSO CRESCENT QUICKPASS (SUTURE) IMPLANT
MANIFOLD NEPTUNE II (INSTRUMENTS) ×3 IMPLANT
NDL SPNL 18GX3.5 QUINCKE PK (NEEDLE) ×1 IMPLANT
NEEDLE SPNL 18GX3.5 QUINCKE PK (NEEDLE) ×3 IMPLANT
NS IRRIG 1000ML POUR BTL (IV SOLUTION) ×3 IMPLANT
PACK SHOULDER (CUSTOM PROCEDURE TRAY) ×3 IMPLANT
PAD ARMBOARD 7.5X6 YLW CONV (MISCELLANEOUS) ×6 IMPLANT
PORT APPOLLO RF 90DEGREE MULTI (SURGICAL WAND) ×2 IMPLANT
SLING ARM IMMOBILIZER MED (SOFTGOODS) IMPLANT
SPONGE LAP 4X18 RFD (DISPOSABLE) ×3 IMPLANT
SUCTION FRAZIER TIP 8 FR DISP (SUCTIONS) ×2
SUCTION TUBE FRAZIER 8FR DISP (SUCTIONS) IMPLANT
SUT ETHILON 3 0 PS 1 (SUTURE) ×6 IMPLANT
SUT FIBERWIRE 2-0 18 17.9 3/8 (SUTURE)
SUT MNCRL AB 3-0 PS2 18 (SUTURE) ×2 IMPLANT
SUT VIC AB 0 CT1 27 (SUTURE) ×2
SUT VIC AB 0 CT1 27XBRD ANBCTR (SUTURE) IMPLANT
SUT VIC AB 2-0 CT1 27 (SUTURE) ×4
SUT VIC AB 2-0 CT1 TAPERPNT 27 (SUTURE) IMPLANT
SUT VIC AB 3-0 X1 27 (SUTURE) ×3 IMPLANT
SUT VICRYL 0 UR6 27IN ABS (SUTURE) ×4 IMPLANT
SUTURE FIBERWR 2-0 18 17.9 3/8 (SUTURE) IMPLANT
SYR 20CC LL (SYRINGE) ×3 IMPLANT
SYR 30ML LL (SYRINGE) ×3 IMPLANT
TAPE LABRALWHITE 1.5X36 (TAPE) IMPLANT
TAPE SUT LABRALTAP WHT/BLK (SUTURE) IMPLANT
TOWEL OR 17X24 6PK STRL BLUE (TOWEL DISPOSABLE) ×3 IMPLANT
TOWEL OR 17X26 10 PK STRL BLUE (TOWEL DISPOSABLE) ×3 IMPLANT
TUBING ARTHROSCOPY IRRIG 16FT (MISCELLANEOUS) ×3 IMPLANT
WAND HAND CNTRL MULTIVAC 90 (MISCELLANEOUS) IMPLANT
WATER STERILE IRR 1000ML POUR (IV SOLUTION) ×3 IMPLANT

## 2019-02-12 NOTE — Brief Op Note (Signed)
   02/12/2019  1:44 PM  PATIENT:  Leonarda Salon  28 y.o. female  PRE-OPERATIVE DIAGNOSIS:  left shoulder slap tear  POST-OPERATIVE DIAGNOSIS:  left shoulder slap tear  PROCEDURE:  Procedure(s): left shoulder arthroscopy, labral repair  and biceps tenodesis  SURGEON:  Surgeon(s): August Saucer Corrie Mckusick, MD  ASSISTANT: April Green RNFA  ANESTHESIA:   general  EBL: 35 ml    Total I/O In: -  Out: 200 [Blood:200]  BLOOD ADMINISTERED: none  DRAINS: none   LOCAL MEDICATIONS USED:  none  SPECIMEN:  No Specimen  COUNTS:  YES  TOURNIQUET:  * No tourniquets in log *  DICTATION: .Other Dictation: Dictation Number done  PLAN OF CARE: Discharge to home after PACU  PATIENT DISPOSITION:  PACU - hemodynamically stable

## 2019-02-12 NOTE — Transfer of Care (Signed)
Immediate Anesthesia Transfer of Care Note  Patient: Denise Small  Procedure(s) Performed: left shoulder arthroscopy, labral debridement and biceps tenodesis (Left Shoulder)  Patient Location: PACU  Anesthesia Type:General and GA combined with regional for post-op pain  Level of Consciousness: drowsy  Airway & Oxygen Therapy: Patient Spontanous Breathing  Post-op Assessment: Report given to RN and Post -op Vital signs reviewed and stable  Post vital signs: Reviewed and stable  Last Vitals:  Vitals Value Taken Time  BP 125/75 02/12/2019  1:58 PM  Temp 36.4 C 02/12/2019  1:57 PM  Pulse 81 02/12/2019  2:00 PM  Resp 25 02/12/2019  2:00 PM  SpO2 97 % 02/12/2019  2:00 PM  Vitals shown include unvalidated device data.  Last Pain:  Vitals:   02/12/19 1045  TempSrc:   PainSc: 1       Patients Stated Pain Goal: 3 (02/12/19 1024)  Complications: No apparent anesthesia complications

## 2019-02-12 NOTE — Anesthesia Postprocedure Evaluation (Signed)
Anesthesia Post Note  Patient: Denise Small  Procedure(s) Performed: left shoulder arthroscopy, labral debridement and biceps tenodesis (Left Shoulder)     Patient location during evaluation: PACU Anesthesia Type: General Level of consciousness: awake and alert, patient cooperative and oriented Pain management: pain level controlled Vital Signs Assessment: post-procedure vital signs reviewed and stable Respiratory status: spontaneous breathing, nonlabored ventilation and respiratory function stable Cardiovascular status: blood pressure returned to baseline and stable Postop Assessment: no apparent nausea or vomiting Anesthetic complications: no    Last Vitals:  Vitals:   02/12/19 1422 02/12/19 1434  BP: 118/87 114/70  Pulse: 78 78  Resp: 20 18  Temp: (!) 36.2 C   SpO2: 100% 100%    Last Pain:  Vitals:   02/12/19 1434  TempSrc:   PainSc: 0-No pain                 Idy Rawling,E. Arnie Clingenpeel

## 2019-02-12 NOTE — Anesthesia Procedure Notes (Signed)
Anesthesia Regional Block: Interscalene brachial plexus block   Pre-Anesthetic Checklist: ,, timeout performed, Correct Patient, Correct Site, Correct Laterality, Correct Procedure, Correct Position, site marked, Risks and benefits discussed,  Surgical consent,  Pre-op evaluation,  At surgeon's request and post-op pain management  Laterality: Left and Upper  Prep: chloraprep       Needles:  Injection technique: Single-shot  Needle Type: Echogenic Stimulator Needle     Needle Length: 9cm  Needle Gauge: 21     Additional Needles:   Procedures:, nerve stimulator,,, ultrasound used (permanent image in chart),,,,   Nerve Stimulator or Paresthesia:  Response: hand twitch, 0.4 mA, 0.1 ms,   Additional Responses:   Narrative:  Start time: 02/12/2019 10:20 AM End time: 02/12/2019 10:27 AM Injection made incrementally with aspirations every 5 mL.  Performed by: Personally  Anesthesiologist: Jairo Ben, MD  Additional Notes: Pt identified in Holding room.  Monitors applied. Working IV access confirmed. Sterile prep, drape L clavicle and neck.  #21ga PNS to hand twitch at 0.8mA threshold.  15cc 0.5% Bupivacaine with 10 Exparel injected incrementally after negative test dose.  Patient asymptomatic, VSS, no heme aspirated, tolerated well.  Sandford Craze, MD

## 2019-02-12 NOTE — Anesthesia Preprocedure Evaluation (Addendum)
Anesthesia Evaluation  Patient identified by MRN, date of birth, ID band Patient awake    Reviewed: Allergy & Precautions, NPO status , Patient's Chart, lab work & pertinent test results  History of Anesthesia Complications Negative for: history of anesthetic complications  Airway Mallampati: I  TM Distance: >3 FB Neck ROM: Full    Dental  (+) Dental Advisory Given   Pulmonary former smoker (recently quit),    breath sounds clear to auscultation       Cardiovascular negative cardio ROS   Rhythm:Regular Rate:Normal     Neuro/Psych  Headaches,    GI/Hepatic negative GI ROS, Neg liver ROS,   Endo/Other  obese  Renal/GU negative Renal ROS     Musculoskeletal   Abdominal (+) + obese,   Peds  Hematology negative hematology ROS (+)   Anesthesia Other Findings   Reproductive/Obstetrics OCP: pt understands OCP may be ineffective x2 weeks post op                           Anesthesia Physical Anesthesia Plan  ASA: II  Anesthesia Plan: General   Post-op Pain Management: GA combined w/ Regional for post-op pain   Induction: Intravenous  PONV Risk Score and Plan: 3 and Ondansetron, Dexamethasone and Scopolamine patch - Pre-op  Airway Management Planned: Oral ETT  Additional Equipment:   Intra-op Plan:   Post-operative Plan: Extubation in OR  Informed Consent: I have reviewed the patients History and Physical, chart, labs and discussed the procedure including the risks, benefits and alternatives for the proposed anesthesia with the patient or authorized representative who has indicated his/her understanding and acceptance.     Dental advisory given  Plan Discussed with: CRNA and Surgeon  Anesthesia Plan Comments: (Plan routine monitors, GETA with interscalene block for post op analgesia)        Anesthesia Quick Evaluation

## 2019-02-12 NOTE — Op Note (Signed)
NAME: KIMLY, STEIERT MEDICAL RECORD EY:[id number] ACCOUNT [id number] DATE OF BIRTH:[birth year] FACILITY: [facility] LOCATION: [facility] PHYSICIAN:Tijah Hane Diamantina Providence, MD  OPERATIVE REPORT  DATE OF PROCEDURE:  02/12/2019  PREOPERATIVE DIAGNOSIS:  Left shoulder superior labral tear.  POSTOPERATIVE DIAGNOSIS:  Left shoulder superior labral tear.  PROCEDURE:  Left shoulder arthroscopy with labral repair of the anterior middle labrum with subsequent open biceps tenodesis.  SURGEON:  Cammy Copa, MD  ASSISTANT:  April Green  INDICATIONS:  Loraine Leriche is a 28 year old patient with left shoulder pain of longstanding duration.  He presents for operative management after explanation of risks and benefits.  OPERATIVE FINDINGS: 1.  Examination under anesthesia:  The patient had external rotation at 15 degrees of abduction to about 80 degrees in both shoulders with good stability.  Anterior and posterior less than 1+ anterior instability and less than 1+ posterior instability  and less than a centimeter sulcus sign bilaterally. 2.  Diagnostic and operative arthroscopy: a.  Intact rotator cuff. b.  Intact glenohumeral articular surfaces. c.  There was a tear of the labrum with evidence of pulling away of the tissue from the previous anchor, and this went from approximately the 8:30 position up to the 12 o'clock position.  The posterior superior labrum was intact.  The anterior inferior  and posterior inferior glenohumeral ligaments were intact, and there was no Bankart lesion or Hill-Sachs lesion or reverse Bankart or reverse Hill-Sachs.  PROCEDURE IN DETAIL:  The patient was brought to the operating room where general anesthetic was induced.  Preoperative antibiotics administered.  Timeout was called.  Both shoulders were examined under anesthesia.  The patient was then placed in the  beach-chair position with the head in neutral position.  Left arm, shoulder and hand prescrubbed with alcohol and  Betadine, allowed to air dry, prepped with DuraPrep solution and draped in a sterile manner.  Collier Flowers was used to cover the axilla as well as  seal the operative field.  Time-out was called.  Thirty mL of saline was injected into the shoulder joint.  Posterior portal was created 2 cm medial and posterior to the posterolateral margin of the acromion.  Anterior portal was created under direct  visualization.  The patient was noted to have intact rotator cuff.  The patient did have an anterior superior labral tear from about the 8:30 position to the 12 o'clock position.  The posterior superior labrum was intact.  Posterior inferior and anterior  inferior glenohumeral ligaments were intact.  No Bankart lesion, no reverse Bankart no Hill-Sachs lesion.  At this time, anterior portals x2 created under direct visualization.  The patient's labral tissue which was detached was sutured x2.  This was  about at the 8:30 and 9:30 position.  The anchor was placed in this location in order to secure the superior aspect of that labral tissue which was primarily in the middle glenohumeral ligament.  After this, the biceps tendon was released and the labral  tissue was removed.  This gave no mechanical source for irritation or clicking in the anterior superior quadrant.  Following this, Arthrex suture anchors were utilized to secure in that 9:00 to 10 o'clock position without undue tension the remaining  superior aspect of the labral tissue.  Again, this was not an anterior instability type picture but more labral tissue which had pulled away.  This was reattached just to prevent undue stress at the superior aspect of the anterior inferior glenohumeral  ligament.  Following this, the joint was thoroughly irrigated.  Instruments were removed and portals were closed using 2-0 Vicryl, 3-0 nylon.  An incision was then made in the anterolateral margin of the acromion, deltoid split a measured distance of 4  cm and marked with #1  Vicryl suture between the anterior and middle raphae.  The transverse humeral ligament was opened on the medial side.  The biceps tendon was then tenodesed using an Arthrex SwiveLock 6.25 with good tension and good fixation  achieved.  Thorough irrigation again performed.  The deltoid split was repaired using 0 Vicryl suture, 2-0 Vicryl suture followed by 3-0 Monocryl superiorly.  Watertight dressings and a shoulder immobilizer were placed.  The patient tolerated the  procedure well without immediate complications and was transferred to the recovery room in stable condition.  LN/NUANCE  D:02/12/2019 T:02/12/2019 JOB:[id number]

## 2019-02-12 NOTE — Anesthesia Procedure Notes (Signed)
Procedure Name: Intubation Performed by: Milford Cage, CRNA Pre-anesthesia Checklist: Patient identified, Emergency Drugs available, Suction available and Patient being monitored Patient Re-evaluated:Patient Re-evaluated prior to induction Oxygen Delivery Method: Circle System Utilized Preoxygenation: Pre-oxygenation with 100% oxygen Induction Type: IV induction Ventilation: Mask ventilation without difficulty Laryngoscope Size: Mac and 4 Grade View: Grade I Tube type: Oral Number of attempts: 1 Airway Equipment and Method: Stylet and Oral airway Placement Confirmation: ETT inserted through vocal cords under direct vision,  positive ETCO2 and breath sounds checked- equal and bilateral Secured at: 23 cm Tube secured with: Tape Dental Injury: Teeth and Oropharynx as per pre-operative assessment

## 2019-02-12 NOTE — H&P (Signed)
Denise Small is an 28 y.o. female.   Chief Complaint: Left shoulder pain HPI: Denise Small is a 28 year old female with left shoulder pain.  She had an injury years ago.  She ultimately went to surgery for that injury.  This was done elsewhere.  In summary she had one anchor placed in the mid equator region of her glenoid for shoulder symptoms.  Whether not this was instability is difficult to fully assess.  She did not have discrete anterior inferior labral pathology.  She did well for several years but since that time is developed popping in the shoulder and does have a side to side difference in motion.  Subsequent MRI scanning demonstrates 1 anchor around the 3 o'clock position in that shoulder and no definite rotator cuff pathology.  She does have some mechanical symptoms on exam.  She is really more or less at her wits end with this problem just because of its worsening nature.  Past Medical History:  Diagnosis Date  . Headache   . SVD (spontaneous vaginal delivery)    x 1    Past Surgical History:  Procedure Laterality Date  . SHOULDER SURGERY Left    Labrum repair    Family History  Problem Relation Age of Onset  . Arthritis Mother   . Arthritis Father    Social History:  reports that she quit smoking about 7 weeks ago. She smoked 0.00 packs per day for 0.20 years. She has never used smokeless tobacco. She reports that she does not drink alcohol or use drugs.  Allergies:  Allergies  Allergen Reactions  . Sulfa Antibiotics Rash    Medications Prior to Admission  Medication Sig Dispense Refill  . naproxen (NAPROSYN) 500 MG tablet Take 1 tablet (500 mg total) by mouth 2 (two) times daily with a meal. (Patient taking differently: Take 500 mg by mouth 2 (two) times daily as needed (pain). ) 60 tablet 5  . Norethindrone-Ethinyl Estradiol-Fe (GENERESS FE) 0.8-25 MG-MCG tablet Chew 1 tablet by mouth at bedtime.       Results for orders placed or performed during the hospital  encounter of 02/12/19 (from the past 48 hour(s))  Basic metabolic panel     Status: None   Collection Time: 02/12/19  9:00 AM  Result Value Ref Range   Sodium 139 135 - 145 mmol/L   Potassium 4.0 3.5 - 5.1 mmol/L   Chloride 106 98 - 111 mmol/L   CO2 22 22 - 32 mmol/L   Glucose, Bld 82 70 - 99 mg/dL   BUN 13 6 - 20 mg/dL   Creatinine, Ser 0.17 0.44 - 1.00 mg/dL   Calcium 9.2 8.9 - 79.3 mg/dL   GFR calc non Af Amer >60 >60 mL/min   GFR calc Af Amer >60 >60 mL/min   Anion gap 11 5 - 15    Comment: Performed at [facility], [address], [place], [place] [address]  CBC     Status: None   Collection Time: 02/12/19  9:00 AM  Result Value Ref Range   WBC 5.6 4.0 - 10.5 K/uL   RBC 4.78 3.87 - 5.11 MIL/uL   Hemoglobin 13.0 12.0 - 15.0 g/dL   HCT 92.3 30.0 - 76.2 %   MCV 83.5 80.0 - 100.0 fL   MCH 27.2 26.0 - 34.0 pg   MCHC 32.6 30.0 - 36.0 g/dL   RDW 26.3 33.5 - 45.6 %   Platelets 200 150 - 400 K/uL   nRBC  0.0 0.0 - 0.2 %    Comment: Performed at [facility], [address] [place], [place] [id number]  Pregnancy, urine POC     Status: None   Collection Time: 02/12/19  9:09 AM  Result Value Ref Range   Preg Test, Ur NEGATIVE NEGATIVE    Comment:        THE SENSITIVITY OF THIS METHODOLOGY IS >24 mIU/mL    No results found.  Review of Systems  Musculoskeletal: Positive for joint pain.  All other systems reviewed and are negative.   Blood pressure 132/79, pulse 71, temperature 98.7 F (37.1 C), temperature source Oral, resp. rate 18, height 6' (1.829 m), weight 110.7 kg, last menstrual period 01/25/2019, SpO2 99 %. Physical Exam  Constitutional: She appears well-developed.  HENT:  Head: Normocephalic.  Eyes: Pupils are equal, round, and reactive to light.  Neck: Normal range of motion.  Cardiovascular: Normal rate.  Respiratory: Effort normal.  Neurological: She is alert.  Skin: Skin is warm.  Psychiatric: She has a normal mood and affect.  Examination of  the left shoulder demonstrates a little bit less external rotation and forward flexion on the left compared to the right.  This is about 10 degrees side to side difference.  Strength is good rotator cuff testing on the left infraspinatus supraspinatus and subscap muscle testing.  No real apprehension is present.  She does have some coarse grinding with internal/external rotation at 90 degrees of abduction.  O'Brien's testing equivocal on the left positive on the right.  She has well-healed surgical incisions.  Deltoid is functional.  No AC joint tenderness to direct palpation of the left or right hand side  Assessment/Plan Impression is left shoulder pain longstanding duration worsening over the past several years.  She really cannot live with it the way it is now.  Her prior surgical note and pictures are reviewed.  Patient does have 1 anchor in the mid glenoid region.  This may or may not have reattached some of the middle glenohumeral ligament or potentially some Buford complex back to the to the glenoid.  She does have some restriction of range of motion but more symptomatic is what ever is going on mechanically in the shoulder.  In accordance with the previous note I think there is a chance that we may not find any discrete pathology in the shoulder.  Alternatively I think we may have to release some of that ligament attachment in the mid glenoid region if the Buford complex is been reattached there.  Alternatively superior labral debridement and biceps tenodesis may be indicated if any superior labral pathology is present.  Patient understands the risk and benefits of the procedure including but not limited to incomplete pain relief no pain relief worsening of symptoms and potentially re-creating some of the symptoms she had prior to the surgery.  Nonetheless she is really at a place where she would like to have it at least examined and we will address the pathology that is present as best possible.  Burnard BuntingG  Scott Francena Zender, MD 02/12/2019, 10:49 AM

## 2019-02-13 ENCOUNTER — Encounter (HOSPITAL_COMMUNITY): Payer: Self-pay | Admitting: Orthopedic Surgery

## 2019-02-14 ENCOUNTER — Telehealth: Payer: Self-pay

## 2019-02-14 NOTE — Telephone Encounter (Signed)
Patient requests refill of pain medication  

## 2019-02-14 NOTE — Telephone Encounter (Signed)
Please advise. Thanks.  

## 2019-02-15 ENCOUNTER — Other Ambulatory Visit: Payer: Self-pay | Admitting: Orthopedic Surgery

## 2019-02-15 MED ORDER — METHOCARBAMOL 500 MG PO TABS: 500.0000 mg | ORAL_TABLET | Freq: Three times a day (TID) | ORAL | 0 refills | Status: DC | PRN

## 2019-02-15 NOTE — Telephone Encounter (Signed)
Please advise. Thanks.  

## 2019-02-16 MED ORDER — OXYCODONE HCL 5 MG PO TABS: 5.0000 mg | ORAL_TABLET | ORAL | 0 refills | Status: DC | PRN

## 2019-02-16 NOTE — Telephone Encounter (Signed)
Ok for that

## 2019-02-18 ENCOUNTER — Other Ambulatory Visit: Payer: Self-pay | Admitting: Orthopedic Surgery

## 2019-02-18 ENCOUNTER — Other Ambulatory Visit: Payer: Self-pay

## 2019-02-18 ENCOUNTER — Encounter (HOSPITAL_COMMUNITY): Payer: Self-pay | Admitting: Orthopedic Surgery

## 2019-02-18 MED ORDER — OXYCODONE HCL 5 MG PO TABS: ORAL_TABLET | ORAL | 0 refills | Status: DC

## 2019-02-20 ENCOUNTER — Ambulatory Visit (INDEPENDENT_AMBULATORY_CARE_PROVIDER_SITE_OTHER): Payer: Medicaid Other | Admitting: Orthopedic Surgery

## 2019-02-20 ENCOUNTER — Other Ambulatory Visit: Payer: Self-pay

## 2019-02-20 ENCOUNTER — Encounter: Payer: Self-pay | Admitting: Orthopedic Surgery

## 2019-02-20 DIAGNOSIS — S46112D Strain of muscle, fascia and tendon of long head of biceps, left arm, subsequent encounter: Secondary | ICD-10-CM

## 2019-02-20 NOTE — Addendum Note (Signed)
Addended by: Prescott Parma on: 02/20/2019 10:10 AM   Modules accepted: Orders

## 2019-02-20 NOTE — Progress Notes (Signed)
   Post-Op Visit Note   Patient: Denise Small           Date of Birth: October 07, 1990           MRN: [id number] Visit Date: 02/20/2019 PCP: Clent Jacks, PA-C   Assessment & Plan:  Chief Complaint:  Chief Complaint  Patient presents with  . Left Shoulder - Routine Post Op   Visit Diagnoses:  1. Labral tear of long head of left biceps tendon, subsequent encounter     Plan: Denise Small is a 28 year old patient who is now about a week out left shoulder arthroscopy with biceps tendon release and labral debridement.  I re-anchored her anterior labrum back to the equator region.  The anterior inferior glenohumeral ligament was not involved.  She did have progression and unstable SLAP tear compared to  pictures of her prior surgery.  On examination she has no coarse grinding or crepitus with passive range of motion.  I will start her in some physical therapy for passive range of motion to external rotation at 30 degrees forward flexion to 90 and abduction to 90.  See her back in 2 weeks for clinical recheck.  We will likely discontinue the sling at that time.  Follow-Up Instructions: Return in about 2 weeks (around 03/06/2019).   Orders:  No orders of the defined types were placed in this encounter.  No orders of the defined types were placed in this encounter.   Imaging: No results found.  PMFS History: Patient Active Problem List   Diagnosis Date Noted  . Postpartum care following vaginal delivery 05/04/2015  . Elevated blood pressure reading 05/02/2015  . Group B Streptococcus carrier, antepartum 04/16/2015  . Vaginal delivery 11/26/2014   Past Medical History:  Diagnosis Date  . Headache   . SVD (spontaneous vaginal delivery)    x 1    Family History  Problem Relation Age of Onset  . Arthritis Mother   . Arthritis Father     Past Surgical History:  Procedure Laterality Date  . SHOULDER ARTHROSCOPY WITH LABRAL REPAIR Left 02/12/2019   Procedure: left shoulder  arthroscopy, labral debridement and biceps tenodesis;  Surgeon: Cammy Copa, MD;  Location: [facility] OR;  Service: Orthopedics;  Laterality: Left;  . SHOULDER SURGERY Left    Labrum repair   Social History   Occupational History  . Not on file  Tobacco Use  . Smoking status: Former Smoker    Packs/day: 0.00    Years: 0.20    Pack years: 0.00    Last attempt to quit: 12/2018    Years since quitting: 0.1  . Smokeless tobacco: Never Used  Substance and Sexual Activity  . Alcohol use: No    Frequency: Never  . Drug use: No  . Sexual activity: Yes    Birth control/protection: Pill

## 2019-02-21 ENCOUNTER — Other Ambulatory Visit: Payer: Self-pay

## 2019-02-21 DIAGNOSIS — S46112D Strain of muscle, fascia and tendon of long head of biceps, left arm, subsequent encounter: Secondary | ICD-10-CM

## 2019-02-23 ENCOUNTER — Telehealth: Payer: Self-pay | Admitting: Orthopedic Surgery

## 2019-02-24 NOTE — Telephone Encounter (Signed)
Rx request 

## 2019-02-25 ENCOUNTER — Other Ambulatory Visit: Payer: Self-pay | Admitting: Orthopedic Surgery

## 2019-02-25 MED ORDER — OXYCODONE HCL 5 MG PO TABS: ORAL_TABLET | ORAL | 0 refills | Status: DC

## 2019-02-25 NOTE — Telephone Encounter (Signed)
Please advise. Thanks.  

## 2019-02-25 NOTE — Telephone Encounter (Signed)
Ok for 30 1 po q 8 - 12

## 2019-02-25 NOTE — Telephone Encounter (Signed)
Asking for a Rx refill oxyCODONE (OXY IR/ROXICODONE) 5 MG immediate release tablet  Please advise

## 2019-02-26 NOTE — Telephone Encounter (Signed)
IC PA done approved per notification on [employer] Patient advised.

## 2019-02-26 NOTE — Telephone Encounter (Signed)
Patient calling in about prior arothrzation for medication sent to pharmacy  Good contact number [phone]

## 2019-03-01 ENCOUNTER — Encounter (HOSPITAL_COMMUNITY): Payer: Self-pay

## 2019-03-01 ENCOUNTER — Ambulatory Visit (HOSPITAL_COMMUNITY): Payer: Medicaid Other | Attending: Orthopaedic Surgery

## 2019-03-01 ENCOUNTER — Other Ambulatory Visit: Payer: Self-pay

## 2019-03-01 DIAGNOSIS — R29898 Other symptoms and signs involving the musculoskeletal system: Secondary | ICD-10-CM | POA: Diagnosis present

## 2019-03-01 DIAGNOSIS — M25612 Stiffness of left shoulder, not elsewhere classified: Secondary | ICD-10-CM | POA: Diagnosis present

## 2019-03-01 DIAGNOSIS — M25512 Pain in left shoulder: Secondary | ICD-10-CM

## 2019-03-01 NOTE — Therapy (Signed)
[facility] [address] [place], [place], [address] Phone: [phone]   Fax:  [phone]  Occupational Therapy Evaluation  Patient Details  Name: Denise Small MRN: [id number] Date of Birth: [birth year] Referring Provider (OT): Dr. Rise PaganiniGregory Dean   Encounter Date: 03/01/2019  OT End of Session - 03/01/19 0914    Visit Number  1    Number of Visits  12    Date for OT Re-Evaluation  04/12/19   mini reassess:03/29/19   Authorization Type  medicaid    Authorization Time Period  Requesting initial 3 visits on 03/01/19    OT Start Time  0815    OT Stop Time  0900    OT Time Calculation (min)  45 min    Activity Tolerance  Patient tolerated treatment well    Behavior During Therapy  [facility] for tasks assessed/performed       Past Medical History:  Diagnosis Date  . Headache   . SVD (spontaneous vaginal delivery)    x 1    Past Surgical History:  Procedure Laterality Date  . SHOULDER ARTHROSCOPY WITH LABRAL REPAIR Left 02/12/2019   Procedure: left shoulder arthroscopy, labral debridement and biceps tenodesis;  Surgeon: Cammy Copaean, Gregory Scott, MD;  [facility];  Service: Orthopedics;  Laterality: Left;  . SHOULDER SURGERY Left    Labrum repair    There were no vitals filed for this visit.  Subjective Assessment - 03/01/19 0822    Subjective   S: It popped this morning when I was readjusting my sling.    Pertinent History  Patient is a 28 y/o female S/P left shoulder arthroscopy with bicep tendon release and labrel debridement which was completed on 02/12/19. Dr. August Saucerean has referred patient to occupational therapy for evaluation and treatment.    Patient Stated Goals  I want to use my left arm like normal.    Currently in Pain?  Yes    Pain Score  5     Pain Location  Shoulder    Pain Orientation  Left    Pain Descriptors / Indicators  Constant;Aching    Pain Type  Surgical pain    Pain Radiating Towards  towards neck and then down to  elbow sometimes.    Pain Onset  1 to 4 weeks ago    Pain Frequency  Constant    Aggravating Factors   movement or taking out of the sling.    Pain Relieving Factors  pain medication, ice pack machine    Effect of Pain on Daily Activities  patient is unable to utilize her LUE for any daily tasks        [facility] Assessment - 03/01/19 0824      Assessment   Medical Diagnosis  Left Shoulder Pain    Referring Provider (OT)  Dr. Rise PaganiniGregory Dean    Onset Date/Surgical Date  02/14/19    Hand Dominance  Right    Next MD Visit  Sometime this month    Prior Therapy  None for this surgery      Precautions   Precautions  Shoulder    Type of Shoulder Precautions  P/ROM. External rotation to 30, flexion to 90, abduction to 90    Shoulder Interventions  Shoulder sling/immobilizer      Restrictions   Weight Bearing Restrictions  Yes      Balance Screen   Has the patient fallen in the past 6 months  No    Has  the patient had a decrease in activity level because of a fear of falling?   No    Is the patient reluctant to leave their home because of a fear of falling?   No      Home  Environment   Family/patient expects to be discharged to:  Private residence    Additional Comments  Husband is working in the moutains right now and comes home on the weekends. has a 13 y/o son.    Lives With  Family      Prior Function   Level of Independence  Independent    Vocation  Full time employment    Vocation Requirements  office work at [employer]    Leisure  caring for 26 year old, singing, pianist      ADL   ADL comments  Pt is unable to utilize her LUE for any daily tasks at this time.       Mobility   Mobility Status  Independent      Written Expression   Dominant Hand  Right      Vision - History   Baseline Vision  No visual deficits      Cognition   Overall Cognitive Status  Within Functional Limits for tasks assessed      Observation/Other Assessments   Focus on Therapeutic  Outcomes (FOTO)   N/A      Posture/Postural Control   Posture/Postural Control  Postural limitations    Postural Limitations  Rounded Shoulders      ROM / Strength   AROM / PROM / Strength  AROM;PROM;Strength      Palpation   Palpation comment  moderate fascial restrictions in left scapular and shoulder region      AROM   Overall AROM   Unable to assess;Due to precautions      PROM   Overall PROM Comments  Assessed supine. IR/er adducted    PROM Assessment Site  Shoulder    Right/Left Shoulder  Left    Left Shoulder Flexion  90 Degrees    Left Shoulder ABduction  90 Degrees    Left Shoulder Internal Rotation  90 Degrees    Left Shoulder External Rotation  25 Degrees      Strength   Overall Strength  Unable to assess;Due to precautions                      OT Education - 03/01/19 0914    Education Details  Table slides with ROM limitations, pendulums, A/ROM wrist and elbow exercises.    Person(s) Educated  Patient    Methods  Explanation;Demonstration;Verbal cues;Handout    Comprehension  Returned demonstration;Verbalized understanding       OT Short Term Goals - 03/01/19 9563      OT SHORT TERM GOAL #1   Title  Patient will be educated and independent with HEP to increase functional mobility needed to return to using her LUE at her high level of independence for all daily tasks.    Time  3    Period  Weeks    Status  New    Target Date  03/22/19      OT SHORT TERM GOAL #2   Title  Patient will increase her P/ROM of her LUE to WNL to increase ability to complete dressing tasks.    Time  3    Period  Weeks    Status  New      OT SHORT  TERM GOAL #3   Title  Patient will increase her LUE strength to 3/5 in order to complete activities at waist level without difficulty.    Time  3    Period  Weeks    Status  New      OT SHORT TERM GOAL #4   Title  Patient will decrease her left UE fascial restrictions to min amount to increase joint mobility needed  to complete functional reaching tasks.    Time  3    Period  Weeks    Status  New        OT Long Term Goals - 03/01/19 0948      OT LONG TERM GOAL #1   Title  Pt will return to highest level of independence while using her LUE for all daily and work tasks at her highest level of independence.    Time  6    Period  Weeks    Status  New    Target Date  04/12/19      OT LONG TERM GOAL #2   Title  Patient will increase her LUE strength 4/5 to increase her ability to complete household lifting tasks with less difficulty.    Time  6    Period  Weeks    Status  New      OT LONG TERM GOAL #3   Title  Patient will increase LUE A/ROM to WNL to increase ability to reach overhead and behind her back without difficulty.    Time  6    Period  Weeks    Status  New      OT LONG TERM GOAL #4   Title  Patient will decrease her fascial restrictions in her LUE to trace amount in order to increase joint mobility needed to complete all daily tasks.    Time  6    Period  Weeks    Status  New      OT LONG TERM GOAL #5   Title  Patient will report a decrease in pain of approximately 3/10 or less when using her LUE for daily tasks.    Time  6    Period  Weeks    Status  New            Plan - 03/01/19 0916    Clinical Impression Statement  A: Patient is a 28 y/o female S/P left shoulder arthroplasty causing increased pain, fascial restrictions and decreased ROM and strength resulting in difficulty completing daily task, work related tasks, and child care responsibilities.    OT Occupational Profile and History  Detailed Assessment- Review of Records and additional review of physical, cognitive, psychosocial history related to current functional performance    Occupational performance deficits (Please refer to evaluation for details):  ADL's;IADL's;Rest and Sleep;Work;Leisure    Body Structure / Function / Physical Skills  ADL;Muscle spasms;UE functional use;Fascial  restriction;Pain;ROM;Decreased knowledge of precautions;IADL;Mobility;Strength    Rehab Potential  Excellent    Clinical Decision Making  Several treatment options, min-mod task modification necessary    Comorbidities Affecting Occupational Performance:  May have comorbidities impacting occupational performance    Modification or Assistance to Complete Evaluation   No modification of tasks or assist necessary to complete eval    OT Frequency  2x / week    OT Duration  6 weeks    OT Treatment/Interventions  Self-care/ADL training;Ultrasound;Patient/family education;DME and/or AE instruction;Passive range of motion;Cryotherapy;Electrical Stimulation;Therapeutic activities;Manual Therapy;Therapeutic exercise;Moist Heat;Neuromuscular education    Plan  P: Patient will benefit from skilled OT services to increase functional performance during daily, work, and child care tasks using her LUE as her non dominent extremity. Treatment Plan: Follow protocol. Myofascial release, manual stretching, P/ROM, AA/ROM, A/ROM, general shoulder and scapular strengthening.    Consulted and Agree with Plan of Care  Patient       Patient will benefit from skilled therapeutic intervention in order to improve the following deficits and impairments:  Body Structure / Function / Physical Skills  Visit Diagnosis: Acute pain of left shoulder  Stiffness of left shoulder, not elsewhere classified  Other symptoms and signs involving the musculoskeletal system    Problem List Patient Active Problem List   Diagnosis Date Noted  . Postpartum care following vaginal delivery 05/04/2015  . Elevated blood pressure reading 05/02/2015  . Group B Streptococcus carrier, antepartum 04/16/2015  . Vaginal delivery 11/26/2014   Limmie PatriciaLaura Haydin Dunn, OTR/L,CBIS  [phone]  03/01/2019, 9:56 AM  [facility] [address] [place], [place], [address] Phone: [phone]   Fax:   [phone]  Name: Denise Small MRN: [id number] Date of Birth: 08/31/1991

## 2019-03-01 NOTE — Patient Instructions (Addendum)
TOWEL SLIDES COMPLETE FOR 1-3 MINUTES, 3-5 TIMES PER DAY  SHOULDER: Flexion On Table   Place hands on table, elbows straight. Move hips away from body. Press hands down into table. Hold ___ seconds. ___ reps per set, ___ sets per day, ___ days per week  Abduction (Passive)   With arm out to side, resting on table, lower head toward arm, keeping trunk away from table. Hold ____ seconds. Repeat ____ times. Do ____ sessions per day.  Copyright  VHI. All rights reserved.     Internal Rotation (Assistive)   Seated with elbow bent at right angle and held against side, slide arm on table surface in an inward arc. Repeat ____ times. Do ____ sessions per day. Activity: Use this motion to brush crumbs off the table.  Copyright  VHI. All rights reserved.    COMPLETE PENDULUM EXERCISES FOR 30 SECONDS TO A MINUTE EACH, 3-5 TIMES PER DAY. ROM: Pendulum (Side-to-Side)     http://orth.exer.us/792   Copyright  VHI. All rights reserved.  Pendulum Forward/Back   Bend forward 90 at waist, using table for support. Rock body forward and back to swing arm. Repeat ____ times. Do ____ sessions per day.    Copyright  VHI. All rights reserved.  AROM: Wrist Extension   With right palm down, bend wrist up. Repeat 10____ times per set. Do ____ sets per session. Do __3__ sessions per day.  Copyright  VHI. All rights reserved.   AROM: Wrist Flexion   With right palm up, bend wrist up. Repeat ___10_ times per set. Do ____ sets per session. Do __3__ sessions per day.  Copyright  VHI. All rights reserved.   AROM: Forearm Pronation / Supination   With right arm in handshake position, slowly rotate palm down until stretch is felt. Relax. Then rotate palm up until stretch is felt. Repeat __10__ times per set. Do ____ sets per session. Do __3__ sessions per day.    ELBOW FLEXION EXTENSION  Bend your elbow upwards as shown and then lower to a straighten position. 10 times. 3  sessions for day.

## 2019-03-05 ENCOUNTER — Other Ambulatory Visit: Payer: Self-pay | Admitting: Orthopedic Surgery

## 2019-03-05 NOTE — Telephone Encounter (Signed)
Ok to rf? 

## 2019-03-05 NOTE — Telephone Encounter (Signed)
Ok to rf 30 1 po q 8 - 12

## 2019-03-05 NOTE — Telephone Encounter (Signed)
Dean patient 

## 2019-03-06 ENCOUNTER — Other Ambulatory Visit: Payer: Self-pay | Admitting: Orthopedic Surgery

## 2019-03-06 ENCOUNTER — Ambulatory Visit (HOSPITAL_COMMUNITY): Payer: Medicaid Other

## 2019-03-06 ENCOUNTER — Encounter (HOSPITAL_COMMUNITY): Payer: Self-pay

## 2019-03-06 ENCOUNTER — Other Ambulatory Visit: Payer: Self-pay

## 2019-03-06 DIAGNOSIS — R29898 Other symptoms and signs involving the musculoskeletal system: Secondary | ICD-10-CM

## 2019-03-06 DIAGNOSIS — M25512 Pain in left shoulder: Secondary | ICD-10-CM | POA: Diagnosis not present

## 2019-03-06 DIAGNOSIS — M25612 Stiffness of left shoulder, not elsewhere classified: Secondary | ICD-10-CM

## 2019-03-06 MED ORDER — OXYCODONE HCL 5 MG PO TABS: ORAL_TABLET | ORAL | 0 refills | Status: DC

## 2019-03-06 NOTE — Therapy (Signed)
[facility] [address] [place], [place], [address] Phone: [phone]   Fax:  [phone]  Occupational Therapy Treatment  Patient Details  Name: Denise Small MRN: [id number] Date of Birth: Jul 17, 1991 Referring Provider (OT): Dr. Marcene Duos   Encounter Date: 03/06/2019  OT End of Session - 03/06/19 1624    Visit Number  2    Number of Visits  12    Date for OT Re-Evaluation  04/12/19   mini reassess:03/29/19   Authorization Type  medicaid    Authorization Time Period  3 visits approved (03/06/19-03/19/19)    Authorization - Visit Number  1    Authorization - Number of Visits  3    OT Start Time  4665    OT Stop Time  1605    OT Time Calculation (min)  30 min    Activity Tolerance  Patient tolerated treatment well    Behavior During Therapy  [facility] for tasks assessed/performed       Past Medical History:  Diagnosis Date  . Headache   . SVD (spontaneous vaginal delivery)    x 1    Past Surgical History:  Procedure Laterality Date  . SHOULDER ARTHROSCOPY WITH LABRAL REPAIR Left 02/12/2019   Procedure: left shoulder arthroscopy, labral debridement and biceps tenodesis;  Surgeon: Meredith Pel, MD;  Location: [facility];  Service: Orthopedics;  Laterality: Left;  . SHOULDER SURGERY Left    Labrum repair    There were no vitals filed for this visit.      [facility] OT Assessment - 03/06/19 1550      Assessment   Medical Diagnosis  Left Shoulder Pain      Precautions   Precautions  Shoulder    Type of Shoulder Precautions  P/ROM. External rotation to 30, flexion to 90, abduction to 90    Shoulder Interventions  Shoulder sling/immobilizer               OT Treatments/Exercises (OP) - 03/06/19 1550      Exercises   Exercises  Shoulder;Elbow      Shoulder Exercises: Supine   Protraction  PROM;10 reps    Horizontal ABduction  PROM;10 reps    External Rotation  PROM;10 reps    Internal Rotation  PROM;10 reps     Flexion  PROM;10 reps    ABduction  PROM;10 reps      Shoulder Exercises: Seated   Extension  AROM;10 reps    Retraction  AROM;10 reps    Other Seated Exercises  scapular depression; A/ROM  10X      Shoulder Exercises: Therapy Ball   Flexion  10 reps    ABduction  10 reps      Shoulder Exercises: ROM/Strengthening   Thumb Tacks  1' low level   Anterior Glide  3x10"    Prot/Ret//Elev/Dep  1' low level     Shoulder Exercises: Isometric Strengthening   Flexion  Supine;3X3"    Extension  Supine;3X3"    External Rotation  Supine;3X3"    Internal Rotation  Supine;3X3"    ABduction  Supine;3X3"    ADduction  Supine;3X3"      Elbow Exercises   Elbow Extension  AROM;10 reps    Other elbow exercises  A/ROM Elbow extension; 10X             OT Education - 03/06/19 9935    Education Details  reviewed therapy goals. recommended that patient continue with HEP when session was  finished. Education completed regarding muscle knots and cause of fascial restrictions.    Person(s) Educated  Patient    Methods  Explanation    Comprehension  Verbalized understanding       OT Short Term Goals - 03/06/19 1627      OT SHORT TERM GOAL #1   Title  Patient will be educated and independent with HEP to increase functional mobility needed to return to using her LUE at her high level of independence for all daily tasks.    Time  3    Period  Weeks    Status  On-going    Target Date  03/22/19      OT SHORT TERM GOAL #2   Title  Patient will increase her P/ROM of her LUE to WNL to increase ability to complete dressing tasks.    Time  3    Period  Weeks    Status  On-going      OT SHORT TERM GOAL #3   Title  Patient will increase her LUE strength to 3/5 in order to complete activities at waist level without difficulty.    Time  3    Period  Weeks    Status  On-going      OT SHORT TERM GOAL #4   Title  Patient will decrease her left UE fascial restrictions to min amount to increase  joint mobility needed to complete functional reaching tasks.    Time  3    Period  Weeks    Status  On-going        OT Long Term Goals - 03/06/19 1628      OT LONG TERM GOAL #1   Title  Pt will return to highest level of independence while using her LUE for all daily and work tasks at her highest level of independence.    Time  6    Period  Weeks    Status  On-going      OT LONG TERM GOAL #2   Title  Patient will increase her LUE strength 4/5 to increase her ability to complete household lifting tasks with less difficulty.    Time  6    Period  Weeks    Status  On-going      OT LONG TERM GOAL #3   Title  Patient will increase LUE A/ROM to WNL to increase ability to reach overhead and behind her back without difficulty.    Time  6    Period  Weeks    Status  On-going      OT LONG TERM GOAL #4   Title  Patient will decrease her fascial restrictions in her LUE to trace amount in order to increase joint mobility needed to complete all daily tasks.    Time  6    Period  Weeks    Status  On-going      OT LONG TERM GOAL #5   Title  Patient will report a decrease in pain of approximately 3/10 or less when using her LUE for daily tasks.    Time  6    Period  Weeks    Status  On-going            Plan - 03/06/19 1625    Clinical Impression Statement  A: Initiated myofascial release, manual stretching, therapy ball stretches, and isometric shoulder exercises. Patient able to achieve limited ROM for shoulder flexion and abduction without difficulty. During passive external rotation, patient continues to be  limited although is close to recommended 30 degrees. No complaints of pain during session. patient able to complete all exercises. Difficulty with being completely relaxed during passive stretching.    Body Structure / Function / Physical Skills  ADL;Muscle spasms;UE functional use;Fascial restriction;Pain;ROM;Decreased knowledge of precautions;IADL;Mobility;Strength    Plan   P: follow up on MD appointment. Continue with P/ROM exercises unless otherwise indicated by MD appointment. Add Caudel glide.    Consulted and Agree with Plan of Care  Patient       Patient will benefit from skilled therapeutic intervention in order to improve the following deficits and impairments:   Body Structure / Function / Physical Skills: ADL, Muscle spasms, UE functional use, Fascial restriction, Pain, ROM, Decreased knowledge of precautions, IADL, Mobility, Strength       Visit Diagnosis: 1. Stiffness of left shoulder, not elsewhere classified   2. Acute pain of left shoulder   3. Other symptoms and signs involving the musculoskeletal system       Problem List Patient Active Problem List   Diagnosis Date Noted  . Postpartum care following vaginal delivery 05/04/2015  . Elevated blood pressure reading 05/02/2015  . Group B Streptococcus carrier, antepartum 04/16/2015  . Vaginal delivery 11/26/2014   Ailene Ravel, OTR/L,CBIS  [phone]  03/06/2019, 4:30 PM  [facility] [address] [place], [place], [address] Phone: [phone]   Fax:  [phone]  Name: Denise Small MRN: [id number] Date of Birth: Oct 22, 1990

## 2019-03-07 ENCOUNTER — Encounter: Payer: Self-pay | Admitting: Orthopedic Surgery

## 2019-03-07 ENCOUNTER — Ambulatory Visit (INDEPENDENT_AMBULATORY_CARE_PROVIDER_SITE_OTHER): Payer: Medicaid Other | Admitting: Orthopedic Surgery

## 2019-03-07 DIAGNOSIS — S46112D Strain of muscle, fascia and tendon of long head of biceps, left arm, subsequent encounter: Secondary | ICD-10-CM

## 2019-03-08 ENCOUNTER — Other Ambulatory Visit: Payer: Self-pay

## 2019-03-08 ENCOUNTER — Encounter: Payer: Self-pay | Admitting: Orthopedic Surgery

## 2019-03-08 ENCOUNTER — Encounter

## 2019-03-08 ENCOUNTER — Ambulatory Visit (HOSPITAL_COMMUNITY): Payer: Medicaid Other | Admitting: Specialist

## 2019-03-08 DIAGNOSIS — M25512 Pain in left shoulder: Secondary | ICD-10-CM

## 2019-03-08 DIAGNOSIS — R29898 Other symptoms and signs involving the musculoskeletal system: Secondary | ICD-10-CM

## 2019-03-08 DIAGNOSIS — M25612 Stiffness of left shoulder, not elsewhere classified: Secondary | ICD-10-CM

## 2019-03-08 NOTE — Progress Notes (Signed)
   Post-Op Visit Note   Patient: Denise Small           Date of Birth: 17-Feb-1991           MRN: [id number] Visit Date: 03/07/2019 PCP: Nelwyn Salisbury, PA-C   Assessment & Plan:  Chief Complaint:  Chief Complaint  Patient presents with  . Left Shoulder - Follow-up   Visit Diagnoses: No diagnosis found.  Plan: Denise Small is now about 3 weeks out left shoulder arthroscopy with labral debridement and biceps tenodesis.  She is doing therapy 2 times a week plus home exercise program.  On exam she has pretty good range of motion.  Does not really have anything overhead yet.  I like to DC the sling but have her avoid active biceps for another 3 weeks.  Taking oxycodone as needed.  3-week return for clinical recheck and likely advancement of her exercises.  Passive range of motion above shoulder level is fine.  Follow-Up Instructions: Return in about 3 weeks (around 03/28/2019).   Orders:  No orders of the defined types were placed in this encounter.  No orders of the defined types were placed in this encounter.   Imaging: No results found.  PMFS History: Patient Active Problem List   Diagnosis Date Noted  . Postpartum care following vaginal delivery 05/04/2015  . Elevated blood pressure reading 05/02/2015  . Group B Streptococcus carrier, antepartum 04/16/2015  . Vaginal delivery 11/26/2014   Past Medical History:  Diagnosis Date  . Headache   . SVD (spontaneous vaginal delivery)    x 1    Family History  Problem Relation Age of Onset  . Arthritis Mother   . Arthritis Father     Past Surgical History:  Procedure Laterality Date  . SHOULDER ARTHROSCOPY WITH LABRAL REPAIR Left 02/12/2019   Procedure: left shoulder arthroscopy, labral debridement and biceps tenodesis;  Surgeon: Meredith Pel, MD;  Location: [place];  Service: Orthopedics;  Laterality: Left;  . SHOULDER SURGERY Left    Labrum repair   Social History   Occupational History  . Not on file  Tobacco  Use  . Smoking status: Former Smoker    Packs/day: 0.00    Years: 0.20    Pack years: 0.00    Quit date: 12/2018    Years since quitting: 0.2  . Smokeless tobacco: Never Used  Substance and Sexual Activity  . Alcohol use: No    Frequency: Never  . Drug use: No  . Sexual activity: Yes    Birth control/protection: Pill

## 2019-03-08 NOTE — Telephone Encounter (Signed)
Yes okay to return to work but do not really do anything with the arm.  Okay to keep the arm in the sling during work if needed.

## 2019-03-11 ENCOUNTER — Encounter (HOSPITAL_COMMUNITY): Payer: Self-pay | Admitting: Specialist

## 2019-03-11 NOTE — Therapy (Signed)
[facility] [address] [place], [place], [address] Phone: [phone]   Fax:  [phone]  Occupational Therapy Treatment  Patient Details  Name: Denise Small MRN: [id number] Date of Birth: 1991/06/16 Referring Provider (OT): Dr. Rise PaganiniGregory Dean   Encounter Date: 03/08/2019  OT End of Session - 03/11/19 1119    Visit Number  3    Number of Visits  12    Date for OT Re-Evaluation  04/12/19    Authorization Type  medicaid    Authorization Time Period  3 visits approved (03/06/19-03/19/19)    Authorization - Visit Number  2    Authorization - Number of Visits  3    OT Start Time  1536    OT Stop Time  1615    OT Time Calculation (min)  39 min    Activity Tolerance  Patient tolerated treatment well    Behavior During Therapy  [facility]       Past Medical History:  Diagnosis Date  . Headache   . SVD (spontaneous vaginal delivery)    x 1    Past Surgical History:  Procedure Laterality Date  . SHOULDER ARTHROSCOPY WITH LABRAL REPAIR Left 02/12/2019   Procedure: left shoulder arthroscopy, labral debridement and biceps tenodesis;  Surgeon: Cammy Copaean, Gregory Scott, MD;  [facility];  Service: Orthopedics;  Laterality: Left;  . SHOULDER SURGERY Left    Labrum repair    There were no vitals filed for this visit.  Subjective Assessment - 03/11/19 1118    Subjective   S:  I went to the MD this week, he said we could increase my passive restrictions.  see assessment for details on parameters.    Currently in Pain?  Yes    Pain Score  4     Pain Location  Shoulder    Pain Orientation  Left    Pain Descriptors / Indicators  Aching         [facility] OT Assessment - 03/11/19 0001      Assessment   Medical Diagnosis  Left Shoulder Pain    Referring Provider (OT)  Dr. Rise PaganiniGregory Dean      Precautions   Precautions  Shoulder    Type of Shoulder Precautions  **updated** increase to 45 external rotaton, 100 flexion,  100 abduction through 03/28/19 , and then can begin aa/rom and progress to a/rom per md order.      Shoulder Interventions  Shoulder sling/immobilizer               OT Treatments/Exercises (OP) - 03/11/19 0001      Exercises   Exercises  Shoulder;Elbow      Shoulder Exercises: Supine   Protraction  PROM;10 reps    External Rotation  PROM;10 reps    Internal Rotation  PROM;10 reps    Flexion  PROM;10 reps    ABduction  PROM;10 reps      Shoulder Exercises: Seated   Elevation  AROM;10 reps    Extension  AROM;10 reps    Retraction  AROM;10 reps    Other Seated Exercises  bridges 10 times each      Shoulder Exercises: Therapy Ball   Flexion  10 reps    ABduction  10 reps      Shoulder Exercises: Isometric Strengthening   Flexion  Supine;3X5"    Extension  Supine;3X5"    External Rotation  Supine;3X5"    Internal Rotation  Supine;3X5"  ABduction  Supine;3X5"    ADduction  Supine;3X5"      Elbow Exercises   Elbow Extension  AROM;10 reps    Other elbow exercises  A/ROM Elbow flexion; 10X      Manual Therapy   Manual Therapy  Myofascial release    Manual therapy comments  manual therapy completed seperately than all other intervention this date of service    Myofascial Release  myofascial release and manual stretching to left upper arm, scapular, and shoulder region to decrease pain and restrictions and improve pain free mobilty in left shoulder region.               OT Short Term Goals - 03/11/19 1122      OT SHORT TERM GOAL #1   Title  Patient will be educated and independent with HEP to increase functional mobility needed to return to using her LUE at her high level of independence for all daily tasks.    Time  3    Period  Weeks    Status  On-going    Target Date  03/22/19      OT SHORT TERM GOAL #2   Title  Patient will increase her P/ROM of her LUE to WNL to increase ability to complete dressing tasks.    Time  3    Period  Weeks    Status   On-going      OT SHORT TERM GOAL #3   Title  Patient will increase her LUE strength to 3/5 in order to complete activities at waist level without difficulty.    Time  3    Period  Weeks    Status  On-going      OT SHORT TERM GOAL #4   Title  Patient will decrease her left UE fascial restrictions to min amount to increase joint mobility needed to complete functional reaching tasks.    Time  3    Period  Weeks    Status  On-going        OT Long Term Goals - 03/11/19 1122      OT LONG TERM GOAL #1   Title  Pt will return to highest level of independence while using her LUE for all daily and work tasks at her highest level of independence.    Time  6    Period  Weeks    Status  On-going      OT LONG TERM GOAL #2   Title  Patient will increase her LUE strength 4/5 to increase her ability to complete household lifting tasks with less difficulty.    Time  6    Period  Weeks    Status  On-going      OT LONG TERM GOAL #3   Title  Patient will increase LUE A/ROM to WNL to increase ability to reach overhead and behind her back without difficulty.    Time  6    Period  Weeks    Status  On-going      OT LONG TERM GOAL #4   Title  Patient will decrease her fascial restrictions in her LUE to trace amount in order to increase joint mobility needed to complete all daily tasks.    Time  6    Period  Weeks    Status  On-going      OT LONG TERM GOAL #5   Title  Patient will report a decrease in pain of approximately 3/10 or less when using her LUE  for daily tasks.    Time  6    Period  Weeks    Status  On-going            Plan - 03/11/19 1120    Clinical Impression Statement  A:  Patient able to tolerate increased P/ROM parameters with slight tenderness.    Body Structure / Function / Physical Skills  ADL;Muscle spasms;UE functional use;Fascial restriction;Pain;ROM;Decreased knowledge of precautions;IADL;Mobility;Strength    Plan  P:  Continue with P/ROM through 7/9, then begin  aa/rom and progress as tolerated.  add caudal and inferior glide.       Patient will benefit from skilled therapeutic intervention in order to improve the following deficits and impairments:   Body Structure / Function / Physical Skills: ADL, Muscle spasms, UE functional use, Fascial restriction, Pain, ROM, Decreased knowledge of precautions, IADL, Mobility, Strength       Visit Diagnosis: 1. Stiffness of left shoulder, not elsewhere classified   2. Acute pain of left shoulder   3. Other symptoms and signs involving the musculoskeletal system       Problem List Patient Active Problem List   Diagnosis Date Noted  . Postpartum care following vaginal delivery 05/04/2015  . Elevated blood pressure reading 05/02/2015  . Group B Streptococcus carrier, antepartum 04/16/2015  . Vaginal delivery 11/26/2014    Shirlean MylarBethany H. Beya Tipps, MHA, OTR/L [phone]  03/11/2019, 11:27 AM  [facility] [address] [place], [place], [address] Phone: [phone]   Fax:  [phone]  Name: Denise Small MRN: [id number] Date of Birth: Mar 15, 1991

## 2019-03-12 ENCOUNTER — Encounter: Payer: Self-pay | Admitting: Orthopedic Surgery

## 2019-03-12 NOTE — Telephone Encounter (Signed)
When was last rx and ok to fill norco 1 po q 6 # 30 if not had antything more recently

## 2019-03-12 NOTE — Telephone Encounter (Signed)
Ok to rf [phone]

## 2019-03-13 ENCOUNTER — Encounter (HOSPITAL_COMMUNITY): Payer: Self-pay

## 2019-03-13 ENCOUNTER — Ambulatory Visit (HOSPITAL_COMMUNITY): Payer: Medicaid Other

## 2019-03-13 ENCOUNTER — Other Ambulatory Visit: Payer: Self-pay

## 2019-03-13 DIAGNOSIS — R29898 Other symptoms and signs involving the musculoskeletal system: Secondary | ICD-10-CM

## 2019-03-13 DIAGNOSIS — M25612 Stiffness of left shoulder, not elsewhere classified: Secondary | ICD-10-CM

## 2019-03-13 DIAGNOSIS — M25512 Pain in left shoulder: Secondary | ICD-10-CM | POA: Diagnosis not present

## 2019-03-13 NOTE — Therapy (Signed)
[facility] [address] [place], [place], [address] Phone: [phone]   Fax:  [phone]  Occupational Therapy Treatment  Patient Details  Name: Denise Small MRN: [id number] Date of Birth: 1991-01-15 Referring Provider (OT): Dr. Marcene Duos   Encounter Date: 03/13/2019  OT End of Session - 03/13/19 1741    Visit Number  4    Number of Visits  12    Date for OT Re-Evaluation  04/12/19    Authorization Type  medicaid    Authorization Time Period  3 visits approved (03/06/19-03/19/19) *requesting 12 additional visits from medicaid on 03/13/19    Authorization - Visit Number  3    Authorization - Number of Visits  3    OT Start Time  1340   reassessment for medicaid   OT Stop Time  1413    OT Time Calculation (min)  33 min    Activity Tolerance  Patient tolerated treatment well    Behavior During Therapy  [facility] for tasks assessed/performed       Past Medical History:  Diagnosis Date  . Headache   . SVD (spontaneous vaginal delivery)    x 1    Past Surgical History:  Procedure Laterality Date  . SHOULDER ARTHROSCOPY WITH LABRAL REPAIR Left 02/12/2019   Procedure: left shoulder arthroscopy, labral debridement and biceps tenodesis;  Surgeon: Meredith Pel, MD;  Location: [place];  Service: Orthopedics;  Laterality: Left;  . SHOULDER SURGERY Left    Labrum repair    There were no vitals filed for this visit.  Subjective Assessment - 03/13/19 1736    Subjective   S: I just took my pain medication so the pain is not as bad as it was.    Currently in Pain?  Yes    Pain Score  6     Pain Location  Shoulder    Pain Orientation  Left    Pain Descriptors / Indicators  Aching    Pain Type  Surgical pain    Pain Radiating Towards  sometimes towards neck and down to elbow    Pain Onset  1 to 4 weeks ago    Pain Frequency  Constant    Aggravating Factors   movement    Pain Relieving Factors  pain medication and ice pack machine     Effect of Pain on Daily Activities  Unable to utilize her arm for any daily tasks.         [facility] OT Assessment - 03/13/19 1350      Assessment   Medical Diagnosis  Left Shoulder Pain    Referring Provider (OT)  Dr. Marcene Duos      Precautions   Precautions  Shoulder    Type of Shoulder Precautions  Increase to 45 external rotaton, 100 flexion, 100 abduction through 03/28/19 , and then can begin aa/rom and progress to a/rom per md order.      Shoulder Interventions  Shoulder sling/immobilizer      Palpation   Palpation comment  moderate fascial restrictions in left scapular and shoulder region      AROM   Overall AROM   Unable to assess;Due to precautions      PROM   Overall PROM Comments  Assessed supine. IR/er adducted    PROM Assessment Site  Shoulder    Right/Left Shoulder  Left    Left Shoulder Flexion  102 Degrees   previous: 90   Left Shoulder ABduction  100  Degrees   previous: 90   Left Shoulder Internal Rotation  90 Degrees   previous: same   Left Shoulder External Rotation  30 Degrees   previous: 25     Strength   Overall Strength  Unable to assess;Due to precautions               OT Treatments/Exercises (OP) - 03/13/19 1359      Exercises   Exercises  Shoulder;Elbow      Shoulder Exercises: Supine   Protraction  PROM;10 reps    Horizontal ABduction  PROM;10 reps    External Rotation  PROM;10 reps    Internal Rotation  PROM;10 reps    Flexion  PROM;10 reps    ABduction  PROM;10 reps    Other Supine Exercises  Bridges; 12X; A/ROM      Shoulder Exercises: Seated   Extension  AROM;12 reps    Retraction  AROM;12 reps    Other Seated Exercises  scapular depression; A/ROM  12X      Shoulder Exercises: Therapy Ball   Flexion  15 reps    ABduction  15 reps      Shoulder Exercises: ROM/Strengthening   Thumb Tacks  1'    Anterior Glide  3x10"    Prot/Ret//Elev/Dep  1'      Shoulder Exercises: Isometric Strengthening   Flexion   Supine;3X5"    Extension  Supine;3X5"    External Rotation  Supine;3X5"    Internal Rotation  Supine;3X5"    ABduction  Supine;3X5"    ADduction  Supine;3X5"      Manual Therapy   Manual Therapy  Myofascial release    Manual therapy comments  manual therapy completed seperately than all other intervention this date of service    Myofascial Release  myofascial release and manual stretching to left upper arm, scapular, and shoulder region to decrease pain and restrictions and improve pain free mobilty in left shoulder region.               OT Short Term Goals - 03/13/19 1744      OT SHORT TERM GOAL #1   Title  Patient will be educated and independent with HEP to increase functional mobility needed to return to using her LUE at her high level of independence for all daily tasks.    Time  3    Period  Weeks    Status  On-going    Target Date  03/22/19      OT SHORT TERM GOAL #2   Title  Patient will increase her P/ROM of her LUE to WNL to increase ability to complete dressing tasks.    Time  3    Period  Weeks    Status  On-going      OT SHORT TERM GOAL #3   Title  Patient will increase her LUE strength to 3/5 in order to complete activities at waist level without difficulty.    Time  3    Period  Weeks    Status  On-going      OT SHORT TERM GOAL #4   Title  Patient will decrease her left UE fascial restrictions to min amount to increase joint mobility needed to complete functional reaching tasks.    Time  3    Period  Weeks    Status  On-going        OT Long Term Goals - 03/11/19 1122      OT LONG TERM GOAL #1  Title  Pt will return to highest level of independence while using her LUE for all daily and work tasks at her highest level of independence.    Time  6    Period  Weeks    Status  On-going      OT LONG TERM GOAL #2   Title  Patient will increase her LUE strength 4/5 to increase her ability to complete household lifting tasks with less difficulty.     Time  6    Period  Weeks    Status  On-going      OT LONG TERM GOAL #3   Title  Patient will increase LUE A/ROM to WNL to increase ability to reach overhead and behind her back without difficulty.    Time  6    Period  Weeks    Status  On-going      OT LONG TERM GOAL #4   Title  Patient will decrease her fascial restrictions in her LUE to trace amount in order to increase joint mobility needed to complete all daily tasks.    Time  6    Period  Weeks    Status  On-going      OT LONG TERM GOAL #5   Title  Patient will report a decrease in pain of approximately 3/10 or less when using her LUE for daily tasks.    Time  6    Period  Weeks    Status  On-going            Plan - 03/13/19 1742    Clinical Impression Statement  A: Patient has made improvement with P/ROM measurements since initial evaluation while remaining within MD's protocol limits. Patient continues to have deficits with ROM, strength, pain and fascial restrictions and is unable to utilize her LUE for any daily tasks. Will be able to progress to AA/ROM on 03/28/19. Unable to achieve 45 degres of external rotation this session due to muscle tightness and pain level. VC for form and technique were provided as needed.    Body Structure / Function / Physical Skills  ADL;Muscle spasms;UE functional use;Fascial restriction;Pain;ROM;Decreased knowledge of precautions;IADL;Mobility;Strength    Plan  P: Continue OT services 2x a week for 6 weeks focusing on P/ROM within requested ROM limitations and progressing to AA/ROM on 03/28/19 in order to return to using her LUE with all daily tasks. Next session: Continue with P/ROM while working on increasing external rotation to 35 degrees       Patient will benefit from skilled therapeutic intervention in order to improve the following deficits and impairments:   Body Structure / Function / Physical Skills: ADL, Muscle spasms, UE functional use, Fascial restriction, Pain, ROM, Decreased  knowledge of precautions, IADL, Mobility, Strength       Visit Diagnosis: 1. Other symptoms and signs involving the musculoskeletal system   2. Acute pain of left shoulder   3. Stiffness of left shoulder, not elsewhere classified       Problem List Patient Active Problem List   Diagnosis Date Noted  . Postpartum care following vaginal delivery 05/04/2015  . Elevated blood pressure reading 05/02/2015  . Group B Streptococcus carrier, antepartum 04/16/2015  . Vaginal delivery 11/26/2014   Ailene Ravel, OTR/L,CBIS  [phone]  03/13/2019, 5:48 PM  [facility] [address] [place], [place], [address] Phone: [phone]   Fax:  [phone]  Name: ADYLENE DLUGOSZ MRN: [id number] Date of Birth: [birth year]

## 2019-03-15 ENCOUNTER — Telehealth: Payer: Self-pay | Admitting: Orthopedic Surgery

## 2019-03-15 ENCOUNTER — Other Ambulatory Visit: Payer: Self-pay | Admitting: Orthopedic Surgery

## 2019-03-15 MED ORDER — HYDROCODONE-ACETAMINOPHEN 5-325 MG PO TABS: ORAL_TABLET | ORAL | 0 refills | Status: DC

## 2019-03-15 NOTE — Telephone Encounter (Signed)
Denise Small/[facility] called stated: Law requires to have acute pain exception for any narcartic more than 3 days [phone]  Please call her as soon as you can.

## 2019-03-15 NOTE — Telephone Encounter (Signed)
IC diagnosis code given.

## 2019-03-20 ENCOUNTER — Ambulatory Visit (HOSPITAL_COMMUNITY): Payer: Medicaid Other

## 2019-03-20 ENCOUNTER — Telehealth (HOSPITAL_COMMUNITY): Payer: Self-pay

## 2019-03-20 NOTE — Telephone Encounter (Signed)
Pt called to cx this appointment 03/21/2019  no reason given

## 2019-03-21 ENCOUNTER — Ambulatory Visit (HOSPITAL_COMMUNITY): Payer: Medicaid Other

## 2019-03-25 ENCOUNTER — Encounter (HOSPITAL_COMMUNITY): Payer: Self-pay

## 2019-03-25 ENCOUNTER — Other Ambulatory Visit: Payer: Self-pay

## 2019-03-25 ENCOUNTER — Other Ambulatory Visit: Payer: Self-pay | Admitting: Orthopedic Surgery

## 2019-03-25 ENCOUNTER — Ambulatory Visit (HOSPITAL_COMMUNITY): Payer: Medicaid Other | Attending: Orthopaedic Surgery

## 2019-03-25 DIAGNOSIS — M25512 Pain in left shoulder: Secondary | ICD-10-CM | POA: Diagnosis present

## 2019-03-25 DIAGNOSIS — M25612 Stiffness of left shoulder, not elsewhere classified: Secondary | ICD-10-CM | POA: Insufficient documentation

## 2019-03-25 DIAGNOSIS — R29898 Other symptoms and signs involving the musculoskeletal system: Secondary | ICD-10-CM | POA: Insufficient documentation

## 2019-03-25 NOTE — Therapy (Signed)
[facility] [place], [place], [address] Phone: [phone]   Fax:  [phone]  Occupational Therapy Treatment  Patient Details  Name: Denise Small MRN: [id number] Date of Birth: June 06, 1991 Referring Provider (OT): Dr. Marcene Duos   Encounter Date: 03/25/2019  OT End of Session - 03/25/19 1000    Visit Number  5    Number of Visits  12    Date for OT Re-Evaluation  04/12/19    Authorization Type  medicaid    Authorization Time Period  12 visits approved (03/20/19-8/11)    Authorization - Visit Number  1    Authorization - Number of Visits  12    OT Start Time  0935    OT Stop Time  1010    OT Time Calculation (min)  35 min    Activity Tolerance  Patient tolerated treatment well    Behavior During Therapy  [facility] for tasks assessed/performed       Past Medical History:  Diagnosis Date  . Headache   . SVD (spontaneous vaginal delivery)    x 1    Past Surgical History:  Procedure Laterality Date  . SHOULDER ARTHROSCOPY WITH LABRAL REPAIR Left 02/12/2019   Procedure: left shoulder arthroscopy, labral debridement and biceps tenodesis;  Surgeon: Meredith Pel, MD;  Location: [place];  Service: Orthopedics;  Laterality: Left;  . SHOULDER SURGERY Left    Labrum repair    There were no vitals filed for this visit.  Subjective Assessment - 03/25/19 0954    Subjective   S: I had to pick up my son when he fell asleep on the couch and I felt something pop in my shoulder.    Currently in Pain?  Yes    Pain Location  Shoulder    Pain Orientation  Left    Pain Descriptors / Indicators  Aching    Pain Type  Surgical pain    Pain Radiating Towards  sometimes towards neck and down to elbow    Pain Onset  1 to 4 weeks ago    Pain Frequency  Constant    Aggravating Factors   movement    Pain Relieving Factors  pain meducation and ice pack machine    Effect of Pain on Daily Activities  severe effect. Arm is kept within  sling at this time.    Multiple Pain Sites  No         [facility] OT Assessment - 03/25/19 1019      Assessment   Medical Diagnosis  Left Shoulder Pain    Referring Provider (OT)  Dr. Marcene Duos      Precautions   Precautions  Shoulder    Type of Shoulder Precautions  Increase to 45 external rotaton, 100 flexion, 100 abduction through 03/28/19 , and then can begin aa/rom and progress to a/rom per md order.      Shoulder Interventions  Shoulder sling/immobilizer               OT Treatments/Exercises (OP) - 03/25/19 0958      Exercises   Exercises  Shoulder      Shoulder Exercises: Supine   Protraction  PROM;10 reps    Horizontal ABduction  PROM;10 reps    External Rotation  PROM;10 reps    Internal Rotation  PROM;10 reps    Flexion  PROM;10 reps    ABduction  PROM;10 reps      Shoulder Exercises: Seated  Extension  AROM;12 reps    Retraction  AROM;12 reps    Other Seated Exercises  scapular depression; A/ROM  12X      Shoulder Exercises: Therapy Ball   Flexion  15 reps    ABduction  15 reps      Shoulder Exercises: ROM/Strengthening   Thumb Tacks  1'    Anterior Glide  3x10"    Prot/Ret//Elev/Dep  1'    Other ROM/Strengthening Exercises  inferior glide; 3X10"      Shoulder Exercises: Isometric Strengthening   Flexion  Supine;5X5"    Extension  Supine;5X5"    External Rotation  Supine;5X5"    Internal Rotation  Supine;5X5"    ABduction  Supine;5X5"    ADduction  Supine;5X5"      Manual Therapy   Manual Therapy  Myofascial release    Manual therapy comments  manual therapy completed seperately than all other intervention this date of service    Myofascial Release  myofascial release and manual stretching to left upper arm, scapular, and shoulder region to decrease pain and restrictions and improve pain free mobilty in left shoulder region.               OT Short Term Goals - 03/13/19 1744      OT SHORT TERM GOAL #1   Title  Patient will be  educated and independent with HEP to increase functional mobility needed to return to using her LUE at her high level of independence for all daily tasks.    Time  3    Period  Weeks    Status  On-going    Target Date  03/22/19      OT SHORT TERM GOAL #2   Title  Patient will increase her P/ROM of her LUE to WNL to increase ability to complete dressing tasks.    Time  3    Period  Weeks    Status  On-going      OT SHORT TERM GOAL #3   Title  Patient will increase her LUE strength to 3/5 in order to complete activities at waist level without difficulty.    Time  3    Period  Weeks    Status  On-going      OT SHORT TERM GOAL #4   Title  Patient will decrease her left UE fascial restrictions to min amount to increase joint mobility needed to complete functional reaching tasks.    Time  3    Period  Weeks    Status  On-going        OT Long Term Goals - 03/11/19 1122      OT LONG TERM GOAL #1   Title  Pt will return to highest level of independence while using her LUE for all daily and work tasks at her highest level of independence.    Time  6    Period  Weeks    Status  On-going      OT LONG TERM GOAL #2   Title  Patient will increase her LUE strength 4/5 to increase her ability to complete household lifting tasks with less difficulty.    Time  6    Period  Weeks    Status  On-going      OT LONG TERM GOAL #3   Title  Patient will increase LUE A/ROM to WNL to increase ability to reach overhead and behind her back without difficulty.    Time  6    Period  Weeks    Status  On-going      OT LONG TERM GOAL #4   Title  Patient will decrease her fascial restrictions in her LUE to trace amount in order to increase joint mobility needed to complete all daily tasks.    Time  6    Period  Weeks    Status  On-going      OT LONG TERM GOAL #5   Title  Patient will report a decrease in pain of approximately 3/10 or less when using her LUE for daily tasks.    Time  6    Period   Weeks    Status  On-going            Plan - 03/25/19 1014    Clinical Impression Statement  A: Pt reports increased pain/soreness after accidental use of her LUE when she had to pick up her sleeping son from the couch and when she stepped off the boat and fell onto her arm. She was able to complete all exercises during session. She did report pain during session although it did not effect her ability to complete exercises. Mod-max fascial restrictions noted in the middle deltoid region and upper trapezius region. Manual techniques completed to address. Pt was able to achieve all ROM limitations. VC for form and technique as needed.    Body Structure / Function / Physical Skills  ADL;Muscle spasms;UE functional use;Fascial restriction;Pain;ROM;Decreased knowledge of precautions;IADL;Mobility;Strength    Plan  P: Follow up on MD appointment. Progress to AA/ROM if able to tolerate.    Consulted and Agree with Plan of Care  Patient       Patient will benefit from skilled therapeutic intervention in order to improve the following deficits and impairments:   Body Structure / Function / Physical Skills: ADL, Muscle spasms, UE functional use, Fascial restriction, Pain, ROM, Decreased knowledge of precautions, IADL, Mobility, Strength       Visit Diagnosis: 1. Other symptoms and signs involving the musculoskeletal system   2. Acute pain of left shoulder   3. Stiffness of left shoulder, not elsewhere classified       Problem List Patient Active Problem List   Diagnosis Date Noted  . Postpartum care following vaginal delivery 05/04/2015  . Elevated blood pressure reading 05/02/2015  . Group B Streptococcus carrier, antepartum 04/16/2015  . Vaginal delivery 11/26/2014   Ailene Ravel, OTR/L,CBIS  [phone]  03/25/2019, 10:23 AM  [facility] [address] [place], [place], [address] Phone: [phone]   Fax:  [phone]  Name:  Denise Small MRN: [id number] Date of Birth: [birth year]

## 2019-03-25 NOTE — Telephone Encounter (Signed)
methocarbamol & Norco refill request

## 2019-03-26 MED ORDER — HYDROCODONE-ACETAMINOPHEN 5-325 MG PO TABS: ORAL_TABLET | ORAL | 0 refills | Status: DC

## 2019-03-26 MED ORDER — METHOCARBAMOL 500 MG PO TABS: 500.0000 mg | ORAL_TABLET | Freq: Three times a day (TID) | ORAL | 0 refills | Status: DC | PRN

## 2019-03-27 ENCOUNTER — Ambulatory Visit (INDEPENDENT_AMBULATORY_CARE_PROVIDER_SITE_OTHER): Payer: Medicaid Other | Admitting: Orthopedic Surgery

## 2019-03-27 ENCOUNTER — Ambulatory Visit (INDEPENDENT_AMBULATORY_CARE_PROVIDER_SITE_OTHER): Payer: Medicaid Other

## 2019-03-27 ENCOUNTER — Other Ambulatory Visit: Payer: Self-pay

## 2019-03-27 ENCOUNTER — Encounter: Payer: Self-pay | Admitting: Orthopedic Surgery

## 2019-03-27 DIAGNOSIS — S46112D Strain of muscle, fascia and tendon of long head of biceps, left arm, subsequent encounter: Secondary | ICD-10-CM | POA: Diagnosis not present

## 2019-03-28 ENCOUNTER — Ambulatory Visit (HOSPITAL_COMMUNITY): Payer: Medicaid Other | Admitting: Occupational Therapy

## 2019-03-28 ENCOUNTER — Encounter (HOSPITAL_COMMUNITY): Payer: Self-pay | Admitting: Occupational Therapy

## 2019-03-28 ENCOUNTER — Encounter: Payer: Self-pay | Admitting: Orthopedic Surgery

## 2019-03-28 DIAGNOSIS — R29898 Other symptoms and signs involving the musculoskeletal system: Secondary | ICD-10-CM

## 2019-03-28 DIAGNOSIS — M25512 Pain in left shoulder: Secondary | ICD-10-CM

## 2019-03-28 DIAGNOSIS — M25612 Stiffness of left shoulder, not elsewhere classified: Secondary | ICD-10-CM

## 2019-03-28 NOTE — Patient Instructions (Signed)

## 2019-03-28 NOTE — Progress Notes (Signed)
   Post-Op Visit Note   Patient: Denise Small           Date of Birth: 1990/11/22           MRN: [id number] Visit Date: 03/27/2019 PCP: Nelwyn Salisbury, PA-C   Assessment & Plan:  Chief Complaint:  Chief Complaint  Patient presents with  . Left Shoulder - Pain   Visit Diagnoses:  1. Labral tear of long head of left biceps tendon, subsequent encounter     Plan: Denise Small is a patient who is now about 6 weeks out left shoulder arthroscopy with labral debridement 1 anchor repair and biceps tenodesis.  She is doing okay and then went on vacation and rode on a speed boat and had soreness after in the shoulder.  She is had severe pain since that time.  On exam she has good range of motion without instability.  I do not detect any mechanical symptoms.  I am going to have her continue with rehab exercises.  I do not think a structural follow this happened.  No disruption of the biceps tendon tear.  Follow-up in 4 weeks for clinical recheck  Follow-Up Instructions: Return in about 4 weeks (around 04/24/2019).   Orders:  Orders Placed This Encounter  Procedures  . XR Shoulder Left   No orders of the defined types were placed in this encounter.   Imaging: No results found.  PMFS History: Patient Active Problem List   Diagnosis Date Noted  . Postpartum care following vaginal delivery 05/04/2015  . Elevated blood pressure reading 05/02/2015  . Group B Streptococcus carrier, antepartum 04/16/2015  . Vaginal delivery 11/26/2014   Past Medical History:  Diagnosis Date  . Headache   . SVD (spontaneous vaginal delivery)    x 1    Family History  Problem Relation Age of Onset  . Arthritis Mother   . Arthritis Father     Past Surgical History:  Procedure Laterality Date  . SHOULDER ARTHROSCOPY WITH LABRAL REPAIR Left 02/12/2019   Procedure: left shoulder arthroscopy, labral debridement and biceps tenodesis;  Surgeon: Meredith Pel, MD;  Location: [facility];  Service: Orthopedics;   Laterality: Left;  . SHOULDER SURGERY Left    Labrum repair   Social History   Occupational History  . Not on file  Tobacco Use  . Smoking status: Former Smoker    Packs/day: 0.00    Years: 0.20    Pack years: 0.00    Quit date: 12/2018    Years since quitting: 0.2  . Smokeless tobacco: Never Used  Substance and Sexual Activity  . Alcohol use: No    Frequency: Never  . Drug use: No  . Sexual activity: Yes    Birth control/protection: Pill

## 2019-03-28 NOTE — Therapy (Signed)
[facility] [address] [place], [place], [address] Phone: [phone]   Fax:  [phone]  Occupational Therapy Treatment  Patient Details  Name: Denise Small Trembley MRN: [id number] Date of Birth: [birth year] Referring Provider (OT): Dr. Rise PaganiniGregory Dean   Encounter Date: 03/28/2019  OT End of Session - 03/28/19 0953    Visit Number  6    Number of Visits  12    Date for OT Re-Evaluation  04/12/19    Authorization Type  medicaid    Authorization Time Period  12 visits approved (03/20/19-8/11)    Authorization - Visit Number  2    Authorization - Number of Visits  12    OT Start Time  0910    OT Stop Time  0950    OT Time Calculation (min)  40 min    Activity Tolerance  Patient tolerated treatment well    Behavior During Therapy  [facility] for tasks assessed/performed       Past Medical History:  Diagnosis Date  . Headache   . SVD (spontaneous vaginal delivery)    x 1    Past Surgical History:  Procedure Laterality Date  . SHOULDER ARTHROSCOPY WITH LABRAL REPAIR Left 02/12/2019   Procedure: left shoulder arthroscopy, labral debridement and biceps tenodesis;  Surgeon: Cammy Copaean, Gregory [place], MD;  [facility];  Service: Orthopedics;  Laterality: Left;  . SHOULDER SURGERY Left    Labrum repair    There were no vitals filed for this visit.  Subjective Assessment - 03/28/19 0909    Subjective   S: I wear my sling when my arm is really tired.    Currently in Pain?  Yes    Pain Score  5     Pain Location  Shoulder    Pain Orientation  Left    Pain Descriptors / Indicators  Aching;Sore    Pain Type  Acute pain    Pain Radiating Towards  towards the base of neck    Pain Onset  1 to 4 weeks ago    Pain Frequency  Constant    Aggravating Factors   movement    Pain Relieving Factors  pain medication and ice pack    Effect of Pain on Daily Activities  mod/max effect, sling at work         [facility] OT Assessment - 03/28/19 0908       Assessment   Medical Diagnosis  Left Shoulder Pain    Referring Provider (OT)  Dr. Rise PaganiniGregory Dean      Precautions   Precautions  Shoulder    Type of Shoulder Precautions  ROM and strengthening to pain tolerance; work on improving er    Shoulder Interventions  Shoulder sling/immobilizer;For comfort               OT Treatments/Exercises (OP) - 03/28/19 0909      Exercises   Exercises  Shoulder      Shoulder Exercises: Supine   Protraction  PROM;5 reps;AAROM;12 reps    Horizontal ABduction  PROM;5 reps;AAROM;12 reps    External Rotation  PROM;5 reps;AAROM;12 reps    Internal Rotation  PROM;5 reps;AAROM;12 reps    Flexion  PROM;5 reps;AAROM;12 reps    ABduction  PROM;5 reps;AAROM;12 reps      Shoulder Exercises: Standing   Protraction  AAROM;12 reps    Horizontal ABduction  AAROM;12 reps    External Rotation  AAROM;12 reps    Internal Rotation  AAROM;12 reps  Flexion  AAROM;12 reps    ABduction  AAROM;12 reps      Shoulder Exercises: Pulleys   Flexion  1 minute    ABduction  1 minute      Manual Therapy   Manual Therapy  Myofascial release    Manual therapy comments  manual therapy completed seperately than all other intervention this date of service    Myofascial Release  myofascial release and manual stretching to left upper arm, scapular, and shoulder region to decrease pain and restrictions and improve pain free mobilty in left shoulder region.             OT Education - 03/28/19 0934    Education Details  shoulder AA/ROM exercises    Person(s) Educated  Patient    Methods  Explanation;Demonstration;Handout    Comprehension  Verbalized understanding;Returned demonstration       OT Short Term Goals - 03/13/19 1744      OT SHORT TERM GOAL #1   Title  Patient will be educated and independent with HEP to increase functional mobility needed to return to using her LUE at her high level of independence for all daily tasks.    Time  3    Period  Weeks     Status  On-going    Target Date  03/22/19      OT SHORT TERM GOAL #2   Title  Patient will increase her Small/ROM of her LUE to WNL to increase ability to complete dressing tasks.    Time  3    Period  Weeks    Status  On-going      OT SHORT TERM GOAL #3   Title  Patient will increase her LUE strength to 3/5 in order to complete activities at waist level without difficulty.    Time  3    Period  Weeks    Status  On-going      OT SHORT TERM GOAL #4   Title  Patient will decrease her left UE fascial restrictions to min amount to increase joint mobility needed to complete functional reaching tasks.    Time  3    Period  Weeks    Status  On-going        OT Long Term Goals - 03/11/19 1122      OT LONG TERM GOAL #1   Title  Pt will return to highest level of independence while using her LUE for all daily and work tasks at her highest level of independence.    Time  6    Period  Weeks    Status  On-going      OT LONG TERM GOAL #2   Title  Patient will increase her LUE strength 4/5 to increase her ability to complete household lifting tasks with less difficulty.    Time  6    Period  Weeks    Status  On-going      OT LONG TERM GOAL #3   Title  Patient will increase LUE A/ROM to WNL to increase ability to reach overhead and behind her back without difficulty.    Time  6    Period  Weeks    Status  On-going      OT LONG TERM GOAL #4   Title  Patient will decrease her fascial restrictions in her LUE to trace amount in order to increase joint mobility needed to complete all daily tasks.    Time  6    Period  Weeks    Status  On-going      OT LONG TERM GOAL #5   Title  Patient will report a decrease in pain of approximately 3/10 or less when using her LUE for daily tasks.    Time  6    Period  Weeks    Status  On-going            Plan - 03/28/19 [id number]    Clinical Impression Statement  A: Pt reports MD is pleased with her progress, brought updated therapy instructions  to session, scanned into chart. Progressed to AA/ROM in supine and standing today, also added pulleys. Pt with ROM at approximately 75%, er improved to approximately 40 degrees with passive stretching and AA/ROM. Verbal cuing for form and technique, HEP updated.    Body Structure / Function / Physical Skills  ADL;Muscle spasms;UE functional use;Fascial restriction;Pain;ROM;Decreased knowledge of precautions;IADL;Mobility;Strength    Plan  Small: Follow up on HEP, continue to work on progressing ROM. Add wall wash       Patient will benefit from skilled therapeutic intervention in order to improve the following deficits and impairments:   Body Structure / Function / Physical Skills: ADL, Muscle spasms, UE functional use, Fascial restriction, Pain, ROM, Decreased knowledge of precautions, IADL, Mobility, Strength       Visit Diagnosis: 1. Other symptoms and signs involving the musculoskeletal system   2. Acute pain of left shoulder   3. Stiffness of left shoulder, not elsewhere classified       Problem List Patient Active Problem List   Diagnosis Date Noted  . Postpartum care following vaginal delivery 05/04/2015  . Elevated blood pressure reading 05/02/2015  . Group B Streptococcus carrier, antepartum 04/16/2015  . Vaginal delivery 11/26/2014   Ezra SitesLeslie , OTR/L  [phone] 03/28/2019, 9:56 AM  [place] [facility] [address] [place], [place], [address] Phone: [phone]   Fax:  [phone]  Name: Denise Small Ey MRN: [id number] Date of Birth: 12/30/1990

## 2019-04-03 ENCOUNTER — Ambulatory Visit (HOSPITAL_COMMUNITY): Payer: Medicaid Other

## 2019-04-03 ENCOUNTER — Encounter (HOSPITAL_COMMUNITY): Payer: Self-pay

## 2019-04-03 ENCOUNTER — Other Ambulatory Visit: Payer: Self-pay

## 2019-04-03 DIAGNOSIS — M25512 Pain in left shoulder: Secondary | ICD-10-CM

## 2019-04-03 DIAGNOSIS — R29898 Other symptoms and signs involving the musculoskeletal system: Secondary | ICD-10-CM | POA: Diagnosis not present

## 2019-04-03 DIAGNOSIS — M25612 Stiffness of left shoulder, not elsewhere classified: Secondary | ICD-10-CM

## 2019-04-03 NOTE — Therapy (Signed)
[facility] [address] [place], [place], [address] Phone: [phone]   Fax:  [phone]  Occupational Therapy Treatment  Patient Details  Name: Denise Small MRN: [id number] Date of Birth: 15-Mar-1991 Referring Provider (OT): Dr. Marcene Duos   Encounter Date: 04/03/2019  OT End of Session - 04/03/19 0856    Visit Number  7    Number of Visits  12    Date for OT Re-Evaluation  04/12/19    Authorization Type  medicaid    Authorization Time Period  12 visits approved (03/20/19-8/11)    Authorization - Visit Number  3    Authorization - Number of Visits  12    OT Start Time  [phone]    OT Stop Time  0915    OT Time Calculation (min)  40 min    Activity Tolerance  Patient tolerated treatment well    Behavior During Therapy  [facility] for tasks assessed/performed       Past Medical History:  Diagnosis Date  . Headache   . SVD (spontaneous vaginal delivery)    x 1    Past Surgical History:  Procedure Laterality Date  . SHOULDER ARTHROSCOPY WITH LABRAL REPAIR Left 02/12/2019   Procedure: left shoulder arthroscopy, labral debridement and biceps tenodesis;  Surgeon: Meredith Pel, MD;  Location: [place];  Service: Orthopedics;  Laterality: Left;  . SHOULDER SURGERY Left    Labrum repair    There were no vitals filed for this visit.  Subjective Assessment - 04/03/19 0846    Subjective   S: I think I may have over did it yesterday when I cleaned my house.    Currently in Pain?  Yes    Pain Score  7     Pain Location  Shoulder    Pain Descriptors / Indicators  Sore    Pain Type  Acute pain         [facility] OT Assessment - 04/03/19 0847      Assessment   Medical Diagnosis  Left Shoulder Pain      Precautions   Precautions  Shoulder    Type of Shoulder Precautions  ROM and strengthening to pain tolerance; work on improving er    Shoulder Interventions  Shoulder sling/immobilizer;For comfort               OT  Treatments/Exercises (OP) - 04/03/19 0848      Exercises   Exercises  Shoulder      Shoulder Exercises: Supine   Protraction  PROM;5 reps;AAROM;12 reps    Horizontal ABduction  PROM;5 reps;AAROM;12 reps    External Rotation  PROM;5 reps;AAROM;12 reps    Internal Rotation  PROM;5 reps;AAROM;12 reps    Flexion  PROM;5 reps;AAROM;12 reps    ABduction  PROM;5 reps;AAROM;12 reps      Shoulder Exercises: Standing   Protraction  AAROM;12 reps    Horizontal ABduction  AAROM;12 reps    External Rotation  AAROM;12 reps    Internal Rotation  AAROM;12 reps    Flexion  AAROM;12 reps    ABduction  AAROM;12 reps    Extension  Theraband;10 reps    Theraband Level (Shoulder Extension)  Level 2 (Red)    Row  Theraband;10 reps    Theraband Level (Shoulder Row)  Level 2 (Red)    Retraction  Theraband;10 reps    Theraband Level (Shoulder Retraction)  Level 2 (Red)      Shoulder Exercises: Pulleys   Flexion  1 minute   seated   ABduction  1 minute   seated     Shoulder Exercises: ROM/Strengthening   Wall Wash  1'    Proximal Shoulder Strengthening, Supine  10X A/ROM no rest breaks    Proximal Shoulder Strengthening, Seated  10X A/ROM no rest breaks      Manual Therapy   Manual Therapy  Myofascial release    Manual therapy comments  manual therapy completed seperately than all other intervention this date of service    Myofascial Release  myofascial release and manual stretching to left upper arm, scapular, and shoulder region to decrease pain and restrictions and improve pain free mobilty in left shoulder region.             OT Education - 04/03/19 0907    Education Details  red theraband for scapular strengthening    Person(s) Educated  Patient    Methods  Explanation;Demonstration;Verbal cues;Handout    Comprehension  Verbalized understanding;Returned demonstration       OT Short Term Goals - 03/13/19 1744      OT SHORT TERM GOAL #1   Title  Patient will be educated and  independent with HEP to increase functional mobility needed to return to using her LUE at her high level of independence for all daily tasks.    Time  3    Period  Weeks    Status  On-going    Target Date  03/22/19      OT SHORT TERM GOAL #2   Title  Patient will increase her P/ROM of her LUE to WNL to increase ability to complete dressing tasks.    Time  3    Period  Weeks    Status  On-going      OT SHORT TERM GOAL #3   Title  Patient will increase her LUE strength to 3/5 in order to complete activities at waist level without difficulty.    Time  3    Period  Weeks    Status  On-going      OT SHORT TERM GOAL #4   Title  Patient will decrease her left UE fascial restrictions to min amount to increase joint mobility needed to complete functional reaching tasks.    Time  3    Period  Weeks    Status  On-going        OT Long Term Goals - 03/11/19 1122      OT LONG TERM GOAL #1   Title  Pt will return to highest level of independence while using her LUE for all daily and work tasks at her highest level of independence.    Time  6    Period  Weeks    Status  On-going      OT LONG TERM GOAL #2   Title  Patient will increase her LUE strength 4/5 to increase her ability to complete household lifting tasks with less difficulty.    Time  6    Period  Weeks    Status  On-going      OT LONG TERM GOAL #3   Title  Patient will increase LUE A/ROM to WNL to increase ability to reach overhead and behind her back without difficulty.    Time  6    Period  Weeks    Status  On-going      OT LONG TERM GOAL #4   Title  Patient will decrease her fascial restrictions in her LUE to trace  amount in order to increase joint mobility needed to complete all daily tasks.    Time  6    Period  Weeks    Status  On-going      OT LONG TERM GOAL #5   Title  Patient will report a decrease in pain of approximately 3/10 or less when using her LUE for daily tasks.    Time  6    Period  Weeks     Status  On-going            Plan - 04/03/19 0930    Clinical Impression Statement  A: Continued with AA/ROM exercises supine and standing. Added wall wash, proximal shoulder strengthening, and scapular theraband strengthening. Updated HEP with scapular theraband strengthening. Provided VC for form and technique. Min fascial restrictions noted in the left upper trapezius region with manual techniques completed to address.    Body Structure / Function / Physical Skills  ADL;Muscle spasms;UE functional use;Fascial restriction;Pain;ROM;Decreased knowledge of precautions;IADL;Mobility;Strength    Plan  P: Follow up on HEP. Continue to progress ROM. Add muscle energy technique during passive stretching.    Consulted and Agree with Plan of Care  Patient       Patient will benefit from skilled therapeutic intervention in order to improve the following deficits and impairments:   Body Structure / Function / Physical Skills: ADL, Muscle spasms, UE functional use, Fascial restriction, Pain, ROM, Decreased knowledge of precautions, IADL, Mobility, Strength       Visit Diagnosis: 1. Acute pain of left shoulder   2. Stiffness of left shoulder, not elsewhere classified   3. Other symptoms and signs involving the musculoskeletal system       Problem List Patient Active Problem List   Diagnosis Date Noted  . Postpartum care following vaginal delivery 05/04/2015  . Elevated blood pressure reading 05/02/2015  . Group B Streptococcus carrier, antepartum 04/16/2015  . Vaginal delivery 11/26/2014   Limmie PatriciaLaura Marirose Deveney, OTR/L,CBIS  [phone]  04/03/2019, 9:32 AM  [facility] [address] [place], [place], [address] Phone: [phone]   Fax:  [phone]  Name: Leonarda SalonMorgan P Maldonado MRN: [id number] Date of Birth: May 05, 1991

## 2019-04-03 NOTE — Patient Instructions (Signed)

## 2019-04-04 ENCOUNTER — Other Ambulatory Visit: Payer: Self-pay | Admitting: Specialist

## 2019-04-04 NOTE — Telephone Encounter (Signed)
Ok to rf? 

## 2019-04-04 NOTE — Telephone Encounter (Signed)
Can you please send this electronically? 

## 2019-04-04 NOTE — Telephone Encounter (Signed)
Nitka patient 

## 2019-04-04 NOTE — Telephone Encounter (Signed)
Dean patient, has not seen [place]

## 2019-04-04 NOTE — Telephone Encounter (Signed)
Christy please advise, thanks. 

## 2019-04-04 NOTE — Telephone Encounter (Signed)
Can you please send this electronically?

## 2019-04-05 ENCOUNTER — Ambulatory Visit (HOSPITAL_COMMUNITY): Payer: Medicaid Other

## 2019-04-05 ENCOUNTER — Other Ambulatory Visit: Payer: Self-pay

## 2019-04-05 ENCOUNTER — Encounter (HOSPITAL_COMMUNITY): Payer: Self-pay

## 2019-04-05 DIAGNOSIS — M25612 Stiffness of left shoulder, not elsewhere classified: Secondary | ICD-10-CM

## 2019-04-05 DIAGNOSIS — R29898 Other symptoms and signs involving the musculoskeletal system: Secondary | ICD-10-CM | POA: Diagnosis not present

## 2019-04-05 DIAGNOSIS — M25512 Pain in left shoulder: Secondary | ICD-10-CM

## 2019-04-05 MED ORDER — HYDROCODONE-ACETAMINOPHEN 5-325 MG PO TABS: ORAL_TABLET | ORAL | 0 refills | Status: DC

## 2019-04-05 NOTE — Patient Instructions (Signed)
Access Code: [email or web address]  URL: [email or web address]  Date: 04/05/2019  Prepared by: Ailene Ravel   Exercises Shoulder ER Stretch in Abduction - 2 sets - 30 hold - 1x daily - 7x weekly Standing Shoulder External Rotation Stretch at [place] - 2 sets - 30 hold - 1x daily - 7x weekly

## 2019-04-05 NOTE — Therapy (Signed)
[facility] [address] , [place], [address] Phone: [phone]   Fax:  [phone]  Occupational Therapy Treatment  Patient Details  Name: Denise Small MRN: [id number] Date of Birth: 05-24-1991 Referring Provider (OT): Dr. Rise PaganiniGregory Dean   Encounter Date: 04/05/2019  OT End of Session - 04/05/19 0933    Visit Number  8    Number of Visits  12    Date for OT Re-Evaluation  04/12/19    Authorization Type  medicaid    Authorization Time Period  12 visits approved (03/20/19-8/11)    Authorization - Visit Number  4    Authorization - Number of Visits  12    OT Start Time  0919    OT Stop Time  0957    OT Time Calculation (min)  38 min    Activity Tolerance  Patient tolerated treatment well    Behavior During Therapy  [facility] for tasks assessed/performed       Past Medical History:  Diagnosis Date  . Headache   . SVD (spontaneous vaginal delivery)    x 1    Past Surgical History:  Procedure Laterality Date  . SHOULDER ARTHROSCOPY WITH LABRAL REPAIR Left 02/12/2019   Procedure: left shoulder arthroscopy, labral debridement and biceps tenodesis;  Surgeon: Cammy Copaean, Gregory Scott, MD;  [facility];  Service: Orthopedics;  Laterality: Left;  . SHOULDER SURGERY Left    Labrum repair    There were no vitals filed for this visit.  Subjective Assessment - 04/05/19 0919    Subjective   S: I forgot to take my medication this morning so I'm hurting.    Currently in Pain?  Yes    Pain Score  6     Pain Location  Shoulder    Pain Orientation  Left    Pain Descriptors / Indicators  Sore    Pain Type  Acute pain    Pain Radiating Towards  Towards base of neck    Pain Onset  1 to 4 weeks ago    Pain Frequency  Constant    Aggravating Factors   movement and use    Pain Relieving Factors  pain medication and ice pack and heat    Effect of Pain on Daily Activities  mod/max effect    Multiple Pain Sites  No         [facility] OT  Assessment - 04/05/19 0932      Assessment   Medical Diagnosis  Left Shoulder Pain      Precautions   Precautions  Shoulder    Type of Shoulder Precautions  ROM and strengthening to pain tolerance; work on improving er    Shoulder Interventions  Shoulder sling/immobilizer;For comfort               OT Treatments/Exercises (OP) - 04/05/19 0932      Exercises   Exercises  Shoulder      Shoulder Exercises: Supine   Protraction  PROM;5 reps;AAROM;15 reps    Horizontal ABduction  PROM;5 reps;AAROM;15 reps    External Rotation  PROM;5 reps;AAROM;15 reps    Internal Rotation  PROM;5 reps;AAROM;15 reps    Flexion  PROM;5 reps;AAROM;15 reps    ABduction  PROM;5 reps;AAROM;15 reps      Shoulder Exercises: Standing   Protraction  AAROM;15 reps    Horizontal ABduction  AAROM;15 reps    External Rotation  AAROM;15 reps    Internal Rotation  AAROM;15 reps  Flexion  AAROM;15 reps    ABduction  AAROM;15 reps      Shoulder Exercises: Pulleys   Flexion  1 minute   seated   ABduction  1 minute   seated     Shoulder Exercises: ROM/Strengthening   Wall Wash  1'    Proximal Shoulder Strengthening, Supine  12X A/ROM no rest breaks    Proximal Shoulder Strengthening, Seated  12X A/ROM no rest breaks      Shoulder Exercises: Stretch   External Rotation Stretch  2 reps;30 seconds   2 sets w/ shoulder abducted and also adducted     Manual Therapy   Manual Therapy  Myofascial release;Muscle Energy Technique    Manual therapy comments  manual therapy completed seperately than all other intervention this date of service    Myofascial Release  myofascial release and manual stretching to left upper arm, scapular, and shoulder region to decrease pain and restrictions and improve pain free mobilty in left shoulder region.    Muscle Energy Technique  Muscle energy technique completed to external rotatiors to decrease tone and muscle spasm and improve range of motion.              OT Education - 04/05/19 1006    Education Details  external rotation shoulder stretch using wall with arm abducted and also adducted.    Person(s) Educated  Patient    Methods  Explanation;Demonstration;Verbal cues;Handout    Comprehension  Verbalized understanding;Returned demonstration       OT Short Term Goals - 03/13/19 1744      OT SHORT TERM GOAL #1   Title  Patient will be educated and independent with HEP to increase functional mobility needed to return to using her LUE at her high level of independence for all daily tasks.    Time  3    Period  Weeks    Status  On-going    Target Date  03/22/19      OT SHORT TERM GOAL #2   Title  Patient will increase her P/ROM of her LUE to WNL to increase ability to complete dressing tasks.    Time  3    Period  Weeks    Status  On-going      OT SHORT TERM GOAL #3   Title  Patient will increase her LUE strength to 3/5 in order to complete activities at waist level without difficulty.    Time  3    Period  Weeks    Status  On-going      OT SHORT TERM GOAL #4   Title  Patient will decrease her left UE fascial restrictions to min amount to increase joint mobility needed to complete functional reaching tasks.    Time  3    Period  Weeks    Status  On-going        OT Long Term Goals - 03/11/19 1122      OT LONG TERM GOAL #1   Title  Pt will return to highest level of independence while using her LUE for all daily and work tasks at her highest level of independence.    Time  6    Period  Weeks    Status  On-going      OT LONG TERM GOAL #2   Title  Patient will increase her LUE strength 4/5 to increase her ability to complete household lifting tasks with less difficulty.    Time  6    Period  Weeks    Status  On-going      OT LONG TERM GOAL #3   Title  Patient will increase LUE A/ROM to WNL to increase ability to reach overhead and behind her back without difficulty.    Time  6    Period  Weeks    Status   On-going      OT LONG TERM GOAL #4   Title  Patient will decrease her fascial restrictions in her LUE to trace amount in order to increase joint mobility needed to complete all daily tasks.    Time  6    Period  Weeks    Status  On-going      OT LONG TERM GOAL #5   Title  Patient will report a decrease in pain of approximately 3/10 or less when using her LUE for daily tasks.    Time  6    Period  Weeks    Status  On-going            Plan - 04/05/19 1007    Clinical Impression Statement  A: Added muscle energy technique during session to increase ROM of external rotation. patient was able to demonstrate slightly more range. Added stretch to HEP to focus on this motion. Minimal fascial restrictions noted in LUE near base of neck/upper trapezius region. Manual technique completed to address. VC for form and technique utilized during session. No reports of pain even without taking pain medication during exercises.    Body Structure / Function / Physical Skills  ADL;Muscle spasms;UE functional use;Fascial restriction;Pain;ROM;Decreased knowledge of precautions;IADL;Mobility;Strength    Plan  P: Progress to A/ROM supine and standing. Continue with scapular strengthening using red band. Continues muscle energy technique for increasing ROM during passive and active external rotation.       Patient will benefit from skilled therapeutic intervention in order to improve the following deficits and impairments:   Body Structure / Function / Physical Skills: ADL, Muscle spasms, UE functional use, Fascial restriction, Pain, ROM, Decreased knowledge of precautions, IADL, Mobility, Strength       Visit Diagnosis: 1. Stiffness of left shoulder, not elsewhere classified   2. Other symptoms and signs involving the musculoskeletal system   3. Acute pain of left shoulder       Problem List Patient Active Problem List   Diagnosis Date Noted  . Postpartum care following vaginal delivery  05/04/2015  . Elevated blood pressure reading 05/02/2015  . Group B Streptococcus carrier, antepartum 04/16/2015  . Vaginal delivery 11/26/2014   Ailene Ravel, OTR/L,CBIS  [phone]  04/05/2019, 10:09 AM  [facility] [address] [place], [place], [address] Phone: [phone]   Fax:  [phone]  Name: Denise Small MRN: [id number] Date of Birth: 1991/05/10

## 2019-04-08 ENCOUNTER — Other Ambulatory Visit: Payer: Self-pay | Admitting: Specialist

## 2019-04-08 MED ORDER — METHOCARBAMOL 500 MG PO TABS: 500.0000 mg | ORAL_TABLET | Freq: Three times a day (TID) | ORAL | 0 refills | Status: DC | PRN

## 2019-04-08 NOTE — Telephone Encounter (Signed)
Dr Dean patient

## 2019-04-09 ENCOUNTER — Other Ambulatory Visit: Payer: Self-pay

## 2019-04-09 ENCOUNTER — Encounter (HOSPITAL_COMMUNITY): Payer: Self-pay

## 2019-04-09 ENCOUNTER — Ambulatory Visit (HOSPITAL_COMMUNITY): Payer: Medicaid Other

## 2019-04-09 DIAGNOSIS — R29898 Other symptoms and signs involving the musculoskeletal system: Secondary | ICD-10-CM | POA: Diagnosis not present

## 2019-04-09 DIAGNOSIS — M25512 Pain in left shoulder: Secondary | ICD-10-CM

## 2019-04-09 DIAGNOSIS — M25612 Stiffness of left shoulder, not elsewhere classified: Secondary | ICD-10-CM

## 2019-04-09 NOTE — Patient Instructions (Signed)
Repeat all exercises 10-15 times, 1-2 times per day.  1) Shoulder Protraction    Begin with elbows by your side, slowly "punch" straight out in front of you.      2) Shoulder Flexion  Standing:         Begin with arms at your side with thumbs pointed up, slowly raise both arms up and forward towards overhead.      3) Horizontal abduction/adduction    Standing:           Begin with arms straight out in front of you, bring out to the side in at "T" shape. Keep arms straight entire time.       4) Internal & External Rotation    *No band* -Stand with elbows at the side and elbows bent 90 degrees. Move your forearms away from your body, then bring back inward toward the body.     5) Shoulder Abduction  Standing:       Begin with your arms flat on the table next to your side. Slowly move your arms out to the side so that they go overhead, in a jumping jack or snow angel movement.    6) X to V arms (cheerleader move):  Begin with arms straight down, crossed in front of body in an "X". Keeping arms crossed, lift arms straight up overhead. Then spread arms apart into a "V" shape.  Bring back together into x and lower down to starting position.

## 2019-04-09 NOTE — Therapy (Signed)
[facility] [address] [place], [place], [address] Phone: [phone]   Fax:  [phone]  Occupational Therapy Treatment  Patient Details  Name: Leonarda SalonMorgan P Schiavi MRN: [id number] Date of Birth: [birth year] Referring Provider (OT): Dr. Rise PaganiniGregory Dean   Encounter Date: 04/09/2019  OT End of Session - 04/09/19 1624    Visit Number  9    Number of Visits  12    Date for OT Re-Evaluation  04/12/19    Authorization Type  medicaid    Authorization Time Period  12 visits approved (03/20/19-8/11)    Authorization - Visit Number  5    Authorization - Number of Visits  12    OT Start Time  1535    OT Stop Time  1615    OT Time Calculation (min)  40 min    Activity Tolerance  Patient tolerated treatment well    Behavior During Therapy  [facility] for tasks assessed/performed       Past Medical History:  Diagnosis Date  . Headache   . SVD (spontaneous vaginal delivery)    x 1    Past Surgical History:  Procedure Laterality Date  . SHOULDER ARTHROSCOPY WITH LABRAL REPAIR Left 02/12/2019   Procedure: left shoulder arthroscopy, labral debridement and biceps tenodesis;  Surgeon: Cammy Copaean, Gregory Scott, MD;  [facility];  Service: Orthopedics;  Laterality: Left;  . SHOULDER SURGERY Left    Labrum repair    There were no vitals filed for this visit.  Subjective Assessment - 04/09/19 1546    Subjective   S: I had to pull my dog off of my friend's dog and my shoulder popped a lot.    Currently in Pain?  Yes    Pain Score  5     Pain Location  Shoulder    Pain Orientation  Left    Pain Descriptors / Indicators  Sore    Pain Type  Acute pain         [facility] OT Assessment - 04/09/19 1547      Assessment   Medical Diagnosis  Left Shoulder Pain      Precautions   Precautions  Shoulder    Type of Shoulder Precautions  ROM and strengthening to pain tolerance; work on improving er    Shoulder Interventions  Shoulder sling/immobilizer;For comfort                OT Treatments/Exercises (OP) - 04/09/19 1547      Exercises   Exercises  Shoulder      Shoulder Exercises: Supine   Protraction  PROM;5 reps;AROM;10 reps    Horizontal ABduction  PROM;5 reps;AROM;10 reps    External Rotation  PROM;5 reps;AROM;10 reps    Internal Rotation  PROM;5 reps;AROM;10 reps    Flexion  PROM;5 reps;AROM;10 reps    ABduction  PROM;5 reps;AROM;10 reps      Shoulder Exercises: Standing   Protraction  AROM;10 reps    Horizontal ABduction  AROM;10 reps    External Rotation  AROM;10 reps    Internal Rotation  AROM;10 reps    Flexion  AROM;10 reps    ABduction  AROM;10 reps    Extension  Theraband;12 reps    Theraband Level (Shoulder Extension)  Level 2 (Red)    Row  Theraband;12 reps    Theraband Level (Shoulder Row)  Level 2 (Red)    Retraction  Theraband;12 reps    Theraband Level (Shoulder Retraction)  Level 2 (Red)  Shoulder Exercises: ROM/Strengthening   UBE (Upper Arm Bike)  Level 1 2' forward 2' reverse    pace: 5.0-6.0   Wall Wash  1'    Over Head Lace  2' seated    X to V Arms  10X    Proximal Shoulder Strengthening, Supine  12X A/ROM no rest breaks    Proximal Shoulder Strengthening, Seated  12X A/ROM no rest breaks      Manual Therapy   Manual Therapy  Myofascial release    Manual therapy comments  manual therapy completed seperately than all other intervention this date of service    Myofascial Release  myofascial release and manual stretching to left upper arm, scapular, and shoulder region to decrease pain and restrictions and improve pain free mobilty in left shoulder region.             OT Education - 04/09/19 1623    Education Details  A/ROM shoulder exercises. Reviewed HEP with patient and patient is to only complete A/ROM, scapular strengthening with band, and External rotation stretch.    Person(s) Educated  Patient    Methods  Explanation;Demonstration;Verbal cues;Handout    Comprehension  Returned  demonstration;Verbalized understanding       OT Short Term Goals - 03/13/19 1744      OT SHORT TERM GOAL #1   Title  Patient will be educated and independent with HEP to increase functional mobility needed to return to using her LUE at her high level of independence for all daily tasks.    Time  3    Period  Weeks    Status  On-going    Target Date  03/22/19      OT SHORT TERM GOAL #2   Title  Patient will increase her P/ROM of her LUE to WNL to increase ability to complete dressing tasks.    Time  3    Period  Weeks    Status  On-going      OT SHORT TERM GOAL #3   Title  Patient will increase her LUE strength to 3/5 in order to complete activities at waist level without difficulty.    Time  3    Period  Weeks    Status  On-going      OT SHORT TERM GOAL #4   Title  Patient will decrease her left UE fascial restrictions to min amount to increase joint mobility needed to complete functional reaching tasks.    Time  3    Period  Weeks    Status  On-going        OT Long Term Goals - 03/11/19 1122      OT LONG TERM GOAL #1   Title  Pt will return to highest level of independence while using her LUE for all daily and work tasks at her highest level of independence.    Time  6    Period  Weeks    Status  On-going      OT LONG TERM GOAL #2   Title  Patient will increase her LUE strength 4/5 to increase her ability to complete household lifting tasks with less difficulty.    Time  6    Period  Weeks    Status  On-going      OT LONG TERM GOAL #3   Title  Patient will increase LUE A/ROM to WNL to increase ability to reach overhead and behind her back without difficulty.    Time  6  Period  Weeks    Status  On-going      OT LONG TERM GOAL #4   Title  Patient will decrease her fascial restrictions in her LUE to trace amount in order to increase joint mobility needed to complete all daily tasks.    Time  6    Period  Weeks    Status  On-going      OT LONG TERM GOAL #5    Title  Patient will report a decrease in pain of approximately 3/10 or less when using her LUE for daily tasks.    Time  6    Period  Weeks    Status  On-going            Plan - 04/09/19 1624    Clinical Impression Statement  A: Pt reports several pops that occured when she pulled her large dog off of her friend's small dog. She was unable to move her arm initially and she has increased pain. Today she reports pain although less than when the incident occurred. She is able to move it a little better. Pt demonstrated P/ROM [facility]. She was able to progress to A/ROM supine and standing. Does not appear that patient has done any additional damage to her shoulder repair. Updated HEP. VC were provided for form and technique.    Body Structure / Function / Physical Skills  ADL;Muscle spasms;UE functional use;Fascial restriction;Pain;ROM;Decreased knowledge of precautions;IADL;Mobility;Strength    Plan  P: Complete therapy ball circles to increase internal rotation and reaching behind her back.    Consulted and Agree with Plan of Care  Patient       Patient will benefit from skilled therapeutic intervention in order to improve the following deficits and impairments:   Body Structure / Function / Physical Skills: ADL, Muscle spasms, UE functional use, Fascial restriction, Pain, ROM, Decreased knowledge of precautions, IADL, Mobility, Strength       Visit Diagnosis: 1. Stiffness of left shoulder, not elsewhere classified   2. Other symptoms and signs involving the musculoskeletal system   3. Acute pain of left shoulder       Problem List Patient Active Problem List   Diagnosis Date Noted  . Postpartum care following vaginal delivery 05/04/2015  . Elevated blood pressure reading 05/02/2015  . Group B Streptococcus carrier, antepartum 04/16/2015  . Vaginal delivery 11/26/2014   Ailene Ravel, OTR/L,CBIS  [phone]  04/09/2019, 4:27 PM  [facility] [address] [place], [place], [address] Phone: [phone]   Fax:  [phone]  Name: MEYER DOCKERY MRN: [id number] Date of Birth: 26-May-1991

## 2019-04-12 ENCOUNTER — Encounter (HOSPITAL_COMMUNITY): Payer: Self-pay

## 2019-04-12 ENCOUNTER — Other Ambulatory Visit: Payer: Self-pay

## 2019-04-12 ENCOUNTER — Ambulatory Visit (HOSPITAL_COMMUNITY): Payer: Medicaid Other

## 2019-04-12 DIAGNOSIS — M25612 Stiffness of left shoulder, not elsewhere classified: Secondary | ICD-10-CM

## 2019-04-12 DIAGNOSIS — R29898 Other symptoms and signs involving the musculoskeletal system: Secondary | ICD-10-CM

## 2019-04-12 DIAGNOSIS — M25512 Pain in left shoulder: Secondary | ICD-10-CM

## 2019-04-12 NOTE — Therapy (Signed)
[facility] [address] [place], [place], [address] Phone: [phone]   Fax:  [phone]  Occupational Therapy Treatment  Patient Details  Name: Denise Small Rosete MRN: [id number] Date of Birth: 05/08/1991 Referring Provider (OT): Dr. Rise PaganiniGregory Dean   Encounter Date: 04/12/2019  OT End of Session - 04/12/19 0930    Visit Number  10    Number of Visits  12    Date for OT Re-Evaluation  04/12/19    Authorization Type  medicaid    Authorization Time Period  12 visits approved (03/20/19-8/11)    Authorization - Visit Number  6    Authorization - Number of Visits  12    OT Start Time  [phone]    OT Stop Time  [id number]    OT Time Calculation (min)  35 min    Activity Tolerance  Patient tolerated treatment well    Behavior During Therapy  [facility]       Past Medical History:  Diagnosis Date  . Headache   . SVD (spontaneous vaginal delivery)    x 1    Past Surgical History:  Procedure Laterality Date  . SHOULDER ARTHROSCOPY WITH LABRAL REPAIR Left 02/12/2019   Procedure: left shoulder arthroscopy, labral debridement and biceps tenodesis;  Surgeon: Cammy Copaean, Gregory Scott, MD;  [facility];  Service: Orthopedics;  Laterality: Left;  . SHOULDER SURGERY Left    Labrum repair    There were no vitals filed for this visit.  Subjective Assessment - 04/12/19 0928    Subjective   S: I moved my bed around last night and that made it sore.    Currently in Pain?  Yes    Pain Score  4     Pain Location  Shoulder    Pain Orientation  Left    Pain Descriptors / Indicators  Sore    Pain Type  Acute pain    Pain Radiating Towards  towards base of neck    Pain Onset  1 to 4 weeks ago    Pain Frequency  Constant    Aggravating Factors   movement and use    Pain Relieving Factors  pain medication, ice pack, heat    Effect of Pain on Daily Activities  min-mod effect    Multiple Pain Sites  No         [facility] OT Assessment -  04/12/19 0929      Assessment   Medical Diagnosis  Left Shoulder Pain      Precautions   Precautions  Shoulder    Type of Shoulder Precautions  ROM and strengthening to pain tolerance; work on improving er    Shoulder Interventions  Shoulder sling/immobilizer;For comfort               OT Treatments/Exercises (OP) - 04/12/19 0930      Exercises   Exercises  Shoulder      Shoulder Exercises: Supine   Protraction  PROM;5 reps;Strengthening;10 reps    Protraction Weight (lbs)  1    Horizontal ABduction  PROM;5 reps;Strengthening;10 reps    Horizontal ABduction Weight (lbs)  1    External Rotation  PROM;5 reps;Strengthening;10 reps    External Rotation Weight (lbs)  1    Internal Rotation  PROM;5 reps;Strengthening;10 reps    Internal Rotation Weight (lbs)  1    Flexion  PROM;5 reps;Strengthening;10 reps    Shoulder Flexion Weight (lbs)  1    ABduction  PROM;5 reps;Strengthening;10 reps    Shoulder ABduction Weight (lbs)  1      Shoulder Exercises: Standing   Protraction  Strengthening;10 reps    Protraction Weight (lbs)  1    Horizontal ABduction  Strengthening;10 reps    Horizontal ABduction Weight (lbs)  1    External Rotation  Strengthening;10 reps    External Rotation Weight (lbs)  1    Internal Rotation  Strengthening;10 reps    Internal Rotation Weight (lbs)  1    Flexion  Strengthening;10 reps    Shoulder Flexion Weight (lbs)  1    ABduction  Strengthening;10 reps    Shoulder ABduction Weight (lbs)  1    Extension  Theraband;12 reps    Theraband Level (Shoulder Extension)  Level 2 (Red)    Row  Theraband;12 reps    Theraband Level (Shoulder Row)  Level 2 (Red)    Retraction  Theraband;12 reps    Theraband Level (Shoulder Retraction)  Level 2 (Red)      Shoulder Exercises: Therapy Ball   Right/Left  5 reps      Shoulder Exercises: ROM/Strengthening   UBE (Upper Arm Bike)  Level 1 2' forward 2' reverse    pace: 8.0-9.0   Over Head Lace  2' seated     X to V Arms  10X with 1#    Proximal Shoulder Strengthening, Supine  10X 1# no rest breaks    Proximal Shoulder Strengthening, Seated  10X 1# no rest breaks    Ball on Wall  1' flexion 1' abduction green ball      Manual Therapy   Manual Therapy  Muscle Energy Technique;Myofascial release    Manual therapy comments  manual therapy completed seperately than all other intervention this date of service    Myofascial Release  myofascial release and manual stretching to left upper arm, scapular, and shoulder region to decrease pain and restrictions and improve pain free mobilty in left shoulder region.    Muscle Energy Technique  Muscle energy technique completed to external rotatiors to decrease tone and muscle spasm and improve range of motion.             OT Education - 04/12/19 0951    Education Details  Pt to complete shoulder exercises with 1# hand weight, a can of soup, or water bottle to work on strengthening. Continue with scapular strengthening and er stretch.    Person(s) Educated  Patient    Methods  Explanation    Comprehension  Verbalized understanding       OT Short Term Goals - 03/13/19 1744      OT SHORT TERM GOAL #1   Title  Patient will be educated and independent with HEP to increase functional mobility needed to return to using her LUE at her high level of independence for all daily tasks.    Time  3    Period  Weeks    Status  On-going    Target Date  03/22/19      OT SHORT TERM GOAL #2   Title  Patient will increase her Small/ROM of her LUE to WNL to increase ability to complete dressing tasks.    Time  3    Period  Weeks    Status  On-going      OT SHORT TERM GOAL #3   Title  Patient will increase her LUE strength to 3/5 in order to complete activities at waist level without difficulty.    Time  3    Period  Weeks    Status  On-going      OT SHORT TERM GOAL #4   Title  Patient will decrease her left UE fascial restrictions to min amount to increase  joint mobility needed to complete functional reaching tasks.    Time  3    Period  Weeks    Status  On-going        OT Long Term Goals - 03/11/19 1122      OT LONG TERM GOAL #1   Title  Pt will return to highest level of independence while using her LUE for all daily and work tasks at her highest level of independence.    Time  6    Period  Weeks    Status  On-going      OT LONG TERM GOAL #2   Title  Patient will increase her LUE strength 4/5 to increase her ability to complete household lifting tasks with less difficulty.    Time  6    Period  Weeks    Status  On-going      OT LONG TERM GOAL #3   Title  Patient will increase LUE A/ROM to WNL to increase ability to reach overhead and behind her back without difficulty.    Time  6    Period  Weeks    Status  On-going      OT LONG TERM GOAL #4   Title  Patient will decrease her fascial restrictions in her LUE to trace amount in order to increase joint mobility needed to complete all daily tasks.    Time  6    Period  Weeks    Status  On-going      OT LONG TERM GOAL #5   Title  Patient will report a decrease in pain of approximately 3/10 or less when using her LUE for daily tasks.    Time  6    Period  Weeks    Status  On-going            Plan - 04/12/19 2440    Clinical Impression Statement  A: Progressed to strengthening this session. Patient inquired when she is able to start. Per last MD appointment, patient may begin strengthening as able tolerate. Progressed to 1# hand weights this session and focusing on scapular and shoulder strengthening. Pt did fatigue during ball on the wall with weighted ball. Verbalied progression of HEP. VC for form and technique were provided during session. Minimal fascial restrictions and trigger points notied in anterior shoulder region and upper trapezius. Manual techinques completed to address.    Body Structure / Function / Physical Skills  ADL;Muscle spasms;UE functional use;Fascial  restriction;Pain;ROM;Decreased knowledge of precautions;IADL;Mobility;Strength    Plan  Small: Conitnue with shoulder strengthening progressing as able to tolerate. Work towards completing ball on th wall with less rest breaks.    Consulted and Agree with Plan of Care  Patient       Patient will benefit from skilled therapeutic intervention in order to improve the following deficits and impairments:   Body Structure / Function / Physical Skills: ADL, Muscle spasms, UE functional use, Fascial restriction, Pain, ROM, Decreased knowledge of precautions, IADL, Mobility, Strength       Visit Diagnosis: 1. Other symptoms and signs involving the musculoskeletal system   2. Stiffness of left shoulder, not elsewhere classified   3. Acute pain of left shoulder       Problem List Patient  Active Problem List   Diagnosis Date Noted  . Postpartum care following vaginal delivery 05/04/2015  . Elevated blood pressure reading 05/02/2015  . Group B Streptococcus carrier, antepartum 04/16/2015  . Vaginal delivery 11/26/2014   Limmie PatriciaLaura Montey Ebel, OTR/L,CBIS  [phone]  04/12/2019, 9:59 AM  [facility] [address] [place], [place], [address] Phone: [phone]   Fax:  [phone]  Name: Denise Small Issa MRN: [id number] Date of Birth: 1991-01-11

## 2019-04-15 ENCOUNTER — Other Ambulatory Visit: Payer: Self-pay

## 2019-04-16 NOTE — Telephone Encounter (Signed)
Can you please submit electronically.  

## 2019-04-16 NOTE — Telephone Encounter (Signed)
Ok to rf? 

## 2019-04-16 NOTE — Telephone Encounter (Signed)
Ok for 20 1 po q 12

## 2019-04-17 ENCOUNTER — Ambulatory Visit (HOSPITAL_COMMUNITY): Payer: Medicaid Other

## 2019-04-17 ENCOUNTER — Other Ambulatory Visit: Payer: Self-pay

## 2019-04-17 DIAGNOSIS — M25512 Pain in left shoulder: Secondary | ICD-10-CM

## 2019-04-17 DIAGNOSIS — R29898 Other symptoms and signs involving the musculoskeletal system: Secondary | ICD-10-CM

## 2019-04-17 DIAGNOSIS — M25612 Stiffness of left shoulder, not elsewhere classified: Secondary | ICD-10-CM

## 2019-04-17 MED ORDER — HYDROCODONE-ACETAMINOPHEN 5-325 MG PO TABS: ORAL_TABLET | ORAL | 0 refills | Status: DC

## 2019-04-17 NOTE — Therapy (Signed)
[place], [place], [address] Phone: [phone]   Fax:  [phone]  Occupational Therapy Treatment Reassessment/re-cert Patient Details  Name: Denise Small MRN: [id number] Date of Birth: 1991/02/28 Referring Provider (OT): Dr. Marcene Duos   Encounter Date: 04/17/2019  OT End of Session - 04/17/19 1515    Visit Number  11    Number of Visits  12    Date for OT Re-Evaluation  05/15/19    Authorization Type  medicaid    Authorization Time Period  12 visits approved (03/20/19-8/11)    Authorization - Visit Number  7    Authorization - Number of Visits  12    OT Start Time  1440   reassessment/re-cert. Pt checked in late.   OT Stop Time  1517    OT Time Calculation (min)  37 min    Activity Tolerance  Patient tolerated treatment well    Behavior During Therapy  WFL for tasks assessed/performed       Past Medical History:  Diagnosis Date  . Headache   . SVD (spontaneous vaginal delivery)    x 1    Past Surgical History:  Procedure Laterality Date  . SHOULDER ARTHROSCOPY WITH LABRAL REPAIR Left 02/12/2019   Procedure: left shoulder arthroscopy, labral debridement and biceps tenodesis;  Surgeon: Meredith Pel, MD;  Location: [place];  Service: Orthopedics;  Laterality: Left;  . SHOULDER SURGERY Left    Labrum repair    There were no vitals filed for this visit.  Subjective Assessment - 04/17/19 1501    Currently in Pain?  Yes    Pain Score  7     Pain Orientation  Left    Pain Descriptors / Indicators  Sore    Pain Type  Acute pain         [facility] OT Assessment - 04/17/19 1449      Assessment   Medical Diagnosis  Left Shoulder Pain    Referring Provider (OT)  Dr. Marcene Duos    Onset Date/Surgical Date  02/14/19      Precautions   Precautions  Shoulder    Type of Shoulder Precautions  ROM and strengthening to pain tolerance; work on improving er    Shoulder Interventions  Shoulder  sling/immobilizer;For comfort      Prior Function   Level of Independence  Independent      ROM / Strength   AROM / PROM / Strength  Strength;PROM;AROM      Palpation   Palpation comment  MIn fascial restrictions noted in left upper arm, trapezius, and scapularis region.       AROM   Overall AROM Comments  assessed in seated.  external and internal rotation with shoulder adducted    AROM Assessment Site  Shoulder    Right/Left Shoulder  Left    Left Shoulder Flexion  140 Degrees   previous: 110   Left Shoulder ABduction  121 Degrees   previous: 110   Left Shoulder Internal Rotation  90 Degrees   preivous: 70   Left Shoulder External Rotation  52 Degrees   previous: 46     PROM   Overall PROM Comments  Assessed supine. IR/er adducted    PROM Assessment Site  Shoulder    Right/Left Shoulder  Left    Left Shoulder Flexion  150 Degrees   previous: 102   Left Shoulder ABduction  155 Degrees   previous: 100   Left Shoulder  Internal Rotation  90 Degrees   previous: same   Left Shoulder External Rotation  40 Degrees   previous: 30     Strength   Overall Strength Comments  assessed in seated. IR/er adducted    Strength Assessment Site  Shoulder    Right/Left Shoulder  Left    Left Shoulder Flexion  4/5   previous: 4+/5   Left Shoulder ABduction  4/5   previous: 4+/5   Left Shoulder Internal Rotation  5/5   previous: 4+/5   Left Shoulder External Rotation  4+/5   previous: 4+/5              OT Treatments/Exercises (OP) - 04/17/19 1458      Exercises   Exercises  Shoulder      Shoulder Exercises: Supine   Protraction  PROM;5 reps;Strengthening;15 reps    Protraction Weight (lbs)  1    Horizontal ABduction  PROM;5 reps;Strengthening;15 reps    Horizontal ABduction Weight (lbs)  1    External Rotation  PROM;5 reps;Strengthening;15 reps    External Rotation Weight (lbs)  1    Internal Rotation  PROM;5 reps;Strengthening;15 reps    Internal Rotation Weight  (lbs)  1    Flexion  PROM;5 reps;Strengthening;15 reps    Shoulder Flexion Weight (lbs)  1    ABduction  PROM;5 reps;Strengthening;15 reps    Shoulder ABduction Weight (lbs)  1      Shoulder Exercises: Standing   Protraction  Strengthening;15 reps    Protraction Weight (lbs)  1    Horizontal ABduction  Strengthening;15 reps    Horizontal ABduction Weight (lbs)  1    External Rotation  Strengthening;15 reps    External Rotation Weight (lbs)  1    Internal Rotation  Strengthening;15 reps    Internal Rotation Weight (lbs)  1    Flexion  Strengthening;15 reps    Shoulder Flexion Weight (lbs)  1    ABduction  Strengthening;15 reps    Shoulder ABduction Weight (lbs)  1      Shoulder Exercises: Therapy Ball   Other Therapy Ball Exercises  green therapy ball; Chest press, flexion, circles, 10X      Shoulder Exercises: ROM/Strengthening   UBE (Upper Arm Bike)  Level 1 2' forward 2' reverse    pace: 9.0-10.0   Over Head Lace  2' seated    X to V Arms  10X with 1#    Proximal Shoulder Strengthening, Supine  15X 1# no rest breaks    Proximal Shoulder Strengthening, Seated  12X with 1# no rest breaks      Manual Therapy   Manual Therapy  Myofascial release    Manual therapy comments  manual therapy completed seperately than all other intervention this date of service    Myofascial Release  myofascial release and manual stretching to left upper arm, scapular, and shoulder region to decrease pain and restrictions and improve pain free mobilty in left shoulder region.             OT Education - 04/17/19 1514    Education Details  educated on stargazer stretch supine to increase external rotation.    Person(s) Educated  Patient    Methods  Explanation;Verbal cues    Comprehension  Verbalized understanding;Returned demonstration       OT Short Term Goals - 04/17/19 1459      OT SHORT TERM GOAL #1   Title  Patient will be educated and independent with HEP to increase  functional  mobility needed to return to using her LUE at her high level of independence for all daily tasks.    Time  3    Period  Weeks    Status  Achieved    Target Date  03/22/19      OT SHORT TERM GOAL #2   Title  Patient will increase her P/ROM of her LUE to WNL to increase ability to complete dressing tasks.    Time  3    Period  Weeks    Status  Achieved      OT SHORT TERM GOAL #3   Title  Patient will increase her LUE strength to 3/5 in order to complete activities at waist level without difficulty.    Time  3    Period  Weeks    Status  Achieved      OT SHORT TERM GOAL #4   Title  Patient will decrease her left UE fascial restrictions to min amount to increase joint mobility needed to complete functional reaching tasks.    Time  3    Period  Weeks    Status  Achieved        OT Long Term Goals - 04/17/19 1500      OT LONG TERM GOAL #1   Title  Pt will return to highest level of independence while using her LUE for all daily and work tasks.    Time  6    Period  Weeks    Status  On-going      OT LONG TERM GOAL #2   Title  Patient will increase her LUE strength 4/5 to increase her ability to complete household lifting tasks with less difficulty.    Time  6    Period  Weeks    Status  On-going      OT LONG TERM GOAL #3   Title  Patient will increase LUE A/ROM to WNL to increase ability to reach overhead and behind her back without difficulty.    Time  6    Period  Weeks    Status  On-going      OT LONG TERM GOAL #4   Title  Patient will decrease her fascial restrictions in her LUE to trace amount in order to increase joint mobility needed to complete all daily tasks.    Time  6    Period  Weeks    Status  On-going      OT LONG TERM GOAL #5   Title  Patient will report a decrease in pain of approximately 3/10 or less when using her LUE for daily tasks.    Time  6    Period  Weeks    Status  On-going            Plan - 04/17/19 1515    Clinical Impression  Statement  A: Reassessment completed this date. patient has shown improvement with all areas: shoulder strength, ROM, and fascial restrictions. Continues to be limited by pain with reports of additional grinding in shoulder joint during session. pt has met all short term goals at this point and is progressing towards long term goals. Patient reports an increase in functional use of her LUE, she is able to reach and move it better. She continues to have difficulty reaching behind her back or out to the side and with strength.    Body Structure / Function / Physical Skills  ADL;Muscle spasms;UE functional use;Fascial restriction;Pain;ROM;Decreased knowledge of precautions;IADL;Mobility;Strength  OT Frequency  2x / week    OT Duration  4 weeks    Plan  P: Pt will continue to benefit from skilled OT services to focus on mentioned deficits. Next session: sidelying strengthening with 1#.    Consulted and Agree with Plan of Care  Patient       Patient will benefit from skilled therapeutic intervention in order to improve the following deficits and impairments:   Body Structure / Function / Physical Skills: ADL, Muscle spasms, UE functional use, Fascial restriction, Pain, ROM, Decreased knowledge of precautions, IADL, Mobility, Strength       Visit Diagnosis: 1. Other symptoms and signs involving the musculoskeletal system   2. Stiffness of left shoulder, not elsewhere classified   3. Acute pain of left shoulder       Problem List Patient Active Problem List   Diagnosis Date Noted  . Postpartum care following vaginal delivery 05/04/2015  . Elevated blood pressure reading 05/02/2015  . Group B Streptococcus carrier, antepartum 04/16/2015  . Vaginal delivery 11/26/2014   Ailene Ravel, OTR/L,CBIS  [phone]  04/17/2019, 3:49 PM  [facility] [address] [place], [place], [address] Phone: [phone]   Fax:  [phone]  Name: Denise Small MRN: [id number] Date of Birth: 09/05/1991

## 2019-04-19 ENCOUNTER — Ambulatory Visit (HOSPITAL_COMMUNITY): Payer: Medicaid Other

## 2019-04-19 ENCOUNTER — Other Ambulatory Visit: Payer: Self-pay

## 2019-04-19 ENCOUNTER — Encounter (HOSPITAL_COMMUNITY): Payer: Self-pay

## 2019-04-19 DIAGNOSIS — M25512 Pain in left shoulder: Secondary | ICD-10-CM

## 2019-04-19 DIAGNOSIS — R29898 Other symptoms and signs involving the musculoskeletal system: Secondary | ICD-10-CM

## 2019-04-19 DIAGNOSIS — M25612 Stiffness of left shoulder, not elsewhere classified: Secondary | ICD-10-CM

## 2019-04-19 NOTE — Therapy (Signed)
[facility] [address] [place], [place], [address] Phone: [phone]   Fax:  [phone]  Occupational Therapy Treatment  Patient Details  Name: Denise Small MRN: [id number] Date of Birth: August 27, 1991 Referring Provider (OT): Dr. Rise PaganiniGregory Dean   Encounter Date: 04/19/2019  OT End of Session - 04/19/19 0952    Visit Number  12    Number of Visits  20    Date for OT Re-Evaluation  05/15/19    Authorization Type  medicaid    Authorization Time Period  12 visits approved (03/20/19-8/11)    Authorization - Visit Number  8    Authorization - Number of Visits  12    OT Start Time  [phone]   pt checked in late   OT Stop Time  1000    OT Time Calculation (min)  32 min    Activity Tolerance  Patient tolerated treatment well    Behavior During Therapy  [facility] for tasks assessed/performed       Past Medical History:  Diagnosis Date  . Headache   . SVD (spontaneous vaginal delivery)    x 1    Past Surgical History:  Procedure Laterality Date  . SHOULDER ARTHROSCOPY WITH LABRAL REPAIR Left 02/12/2019   Procedure: left shoulder arthroscopy, labral debridement and biceps tenodesis;  Surgeon: Cammy Copaean, Gregory Scott, MD;  [facility];  Service: Orthopedics;  Laterality: Left;  . SHOULDER SURGERY Left    Labrum repair    There were no vitals filed for this visit.  Subjective Assessment - 04/19/19 0940    Subjective   S: I woke up this morning at 4AM. I think I was sleeping on.    Currently in Pain?  Yes    Pain Score  5     Pain Location  Shoulder    Pain Descriptors / Indicators  Sore    Pain Type  Acute pain    Pain Radiating Towards  N/A    Pain Onset  Today    Pain Frequency  Constant    Aggravating Factors   sleeping on it.    Pain Relieving Factors  pain medication, ice pack, heat    Effect of Pain on Daily Activities  min effect    Multiple Pain Sites  No         [facility] OT Assessment - 04/19/19 0942      Assessment   Medical Diagnosis  Left Shoulder Pain      Precautions   Precautions  Shoulder    Type of Shoulder Precautions  ROM and strengthening to pain tolerance; work on improving er    Shoulder Interventions  Shoulder sling/immobilizer;For comfort               OT Treatments/Exercises (OP) - 04/19/19 [id number]      Exercises   Exercises  Shoulder      Shoulder Exercises: Supine   Protraction  PROM;5 reps    Horizontal ABduction  PROM;5 reps    External Rotation  PROM;5 reps    Internal Rotation  PROM;5 reps    Flexion  PROM;5 reps    ABduction  PROM;5 reps      Shoulder Exercises: Prone   Other Prone Exercises  Scapular strengthening; 1#l 15X; Y, I, T arms      Shoulder Exercises: Sidelying   External Rotation  Strengthening;15 reps    External Rotation Weight (lbs)  1    Internal Rotation  Strengthening;15 reps  Internal Rotation Weight (lbs)  1    Flexion  Strengthening;10 reps    Flexion Weight (lbs)  1    ABduction  Strengthening;10 reps    ABduction Weight (lbs)  1    Other Sidelying Exercises  Protraction; 1#; 15X    Other Sidelying Exercises  Horizontal abduction; 15X; 1#      Shoulder Exercises: ROM/Strengthening   Over Head Lace  2' seated    Other ROM/Strengthening Exercises  red band loop: Wall slide with Y arm: 10X; wall walks: 6X up;             OT Education - 04/19/19 0959    Education Details  red band loop scapular strengthening    Person(s) Educated  Patient    Methods  Explanation;Demonstration;Verbal cues;Handout    Comprehension  Returned demonstration;Verbalized understanding       OT Short Term Goals - 04/19/19 1012      OT SHORT TERM GOAL #1   Title  Patient will be educated and independent with HEP to increase functional mobility needed to return to using her LUE at her high level of independence for all daily tasks.    Time  3    Period  Weeks    Target Date  03/22/19      OT SHORT TERM GOAL #2   Title  Patient will increase her  P/ROM of her LUE to WNL to increase ability to complete dressing tasks.    Time  3    Period  Weeks      OT SHORT TERM GOAL #3   Title  Patient will increase her LUE strength to 3/5 in order to complete activities at waist level without difficulty.    Time  3    Period  Weeks      OT SHORT TERM GOAL #4   Title  Patient will decrease her left UE fascial restrictions to min amount to increase joint mobility needed to complete functional reaching tasks.    Time  3    Period  Weeks        OT Long Term Goals - 04/17/19 1500      OT LONG TERM GOAL #1   Title  Pt will return to highest level of independence while using her LUE for all daily and work tasks.    Time  6    Period  Weeks    Status  On-going      OT LONG TERM GOAL #2   Title  Patient will increase her LUE strength 4/5 to increase her ability to complete household lifting tasks with less difficulty.    Time  6    Period  Weeks    Status  On-going      OT LONG TERM GOAL #3   Title  Patient will increase LUE A/ROM to WNL to increase ability to reach overhead and behind her back without difficulty.    Time  6    Period  Weeks    Status  On-going      OT LONG TERM GOAL #4   Title  Patient will decrease her fascial restrictions in her LUE to trace amount in order to increase joint mobility needed to complete all daily tasks.    Time  6    Period  Weeks    Status  On-going      OT LONG TERM GOAL #5   Title  Patient will report a decrease in pain of approximately 3/10 or  less when using her LUE for daily tasks.    Time  6    Period  Weeks    Status  On-going            Plan - 04/19/19 1009    Clinical Impression Statement  A: Myofascial release not completed due to time contraint. Did complete some trigger point release to posterior deltoid region during passive external rotation with shoulder abducted. Added sidelying and prone strengthening. Added scapular and shoulder strengthening with the red band loop and  provided patient with 3 exercises for her HEP. Pt is to complete external rotation stretch and 3 red band loop exercises only for HEP. Anything additional is if she wants. VC for form and technique.    Body Structure / Function / Physical Skills  ADL;Muscle spasms;UE functional use;Fascial restriction;Pain;ROM;Decreased knowledge of precautions;IADL;Mobility;Strength    Plan  P: Continue with scapular strengthening. resume therapy ball exercises. Attempt 2# weight supine.    Consulted and Agree with Plan of Care  Patient       Patient will benefit from skilled therapeutic intervention in order to improve the following deficits and impairments:   Body Structure / Function / Physical Skills: ADL, Muscle spasms, UE functional use, Fascial restriction, Pain, ROM, Decreased knowledge of precautions, IADL, Mobility, Strength       Visit Diagnosis: 1. Acute pain of left shoulder   2. Stiffness of left shoulder, not elsewhere classified   3. Other symptoms and signs involving the musculoskeletal system       Problem List Patient Active Problem List   Diagnosis Date Noted  . Postpartum care following vaginal delivery 05/04/2015  . Elevated blood pressure reading 05/02/2015  . Group B Streptococcus carrier, antepartum 04/16/2015  . Vaginal delivery 11/26/2014   Limmie PatriciaLaura Desaray Marschner, OTR/L,CBIS  [phone]  04/19/2019, 10:12 AM  [facility] [address] [place], [place], [address] Phone: [phone]   Fax:  [phone]  Name: Denise Small MRN: [id number] Date of Birth: 11-15-1990

## 2019-04-19 NOTE — Patient Instructions (Addendum)
Complete the following 1-2 times a day.    SERRATUS WALL SLIDE - ELASTIC BAND  Place an elastic band around your arms at the level of your wrists as shown. Next, place your forearms and hands along a wall so that your elbows are bent and your arms point towards the ceiling. With arms straight lift off the wall.    Then, protract your shoulder blades forward and then slide your arms up the wall as shown.   Return to the original position and repeat. Complete 10 times.     Wall walks - upward  Walk your hand up the wall in a diagonal direction, going against the resistance of the band. Complete 10 times going up. Each time you get to the top it counts as 1.    scapular clocks    start with arm straight and placed at 9 and 3 positions  on the wall . Then with one arm pull band into different clock positions  with either arm.   Slide to 11 for 10 times. 9 for 10 times, then 7 for 10 times.

## 2019-04-22 ENCOUNTER — Other Ambulatory Visit: Payer: Self-pay

## 2019-04-22 ENCOUNTER — Ambulatory Visit (HOSPITAL_COMMUNITY): Payer: Medicaid Other | Attending: Orthopaedic Surgery

## 2019-04-22 ENCOUNTER — Encounter (HOSPITAL_COMMUNITY): Payer: Self-pay

## 2019-04-22 DIAGNOSIS — R29898 Other symptoms and signs involving the musculoskeletal system: Secondary | ICD-10-CM | POA: Diagnosis present

## 2019-04-22 DIAGNOSIS — M25512 Pain in left shoulder: Secondary | ICD-10-CM | POA: Diagnosis present

## 2019-04-22 DIAGNOSIS — M25612 Stiffness of left shoulder, not elsewhere classified: Secondary | ICD-10-CM | POA: Diagnosis present

## 2019-04-22 NOTE — Therapy (Signed)
[facility] [address] [place], [place], [address] Phone: [phone]   Fax:  [phone]  Occupational Therapy Treatment  Patient Details  Name: Denise Small MRN: [id number] Date of Birth: Apr 16, 1991 Referring Provider (OT): Dr. Rise PaganiniGregory Dean   Encounter Date: 04/22/2019  OT End of Session - 04/22/19 1110    Visit Number  13    Number of Visits  20    Date for OT Re-Evaluation  05/15/19    Authorization Type  medicaid    Authorization Time Period  12 visits approved (03/20/19-8/11)    Authorization - Visit Number  9    Authorization - Number of Visits  12    OT Start Time  0935    OT Stop Time  1010    OT Time Calculation (min)  35 min    Activity Tolerance  Patient tolerated treatment well    Behavior During Therapy  [facility] tasks assessed/performed       Past Medical History:  Diagnosis Date  . Headache   . SVD (spontaneous vaginal delivery)    x 1    Past Surgical History:  Procedure Laterality Date  . SHOULDER ARTHROSCOPY WITH LABRAL REPAIR Left 02/12/2019   Procedure: left shoulder arthroscopy, labral debridement and biceps tenodesis;  Surgeon: Cammy Copaean, Gregory Scott, MD;  [facility];  Service: Orthopedics;  Laterality: Left;  . SHOULDER SURGERY Left    Labrum repair    There were no vitals filed for this visit.  Subjective Assessment - 04/22/19 0950    Subjective   S:  I didn't do any exercises this weekend.    Currently in Pain?  Yes    Pain Score  5     Pain Location  Shoulder    Pain Orientation  Left    Pain Descriptors / Indicators  Sore                   OT Treatments/Exercises (OP) - 04/22/19 0952      Exercises   Exercises  Shoulder      Shoulder Exercises: Supine   Protraction  PROM;5 reps;Strengthening;10 reps    Protraction Weight (lbs)  2    Horizontal ABduction  PROM;5 reps;Strengthening;10 reps    Horizontal ABduction Weight (lbs)  2    External Rotation  PROM;5  reps;Strengthening;10 reps    External Rotation Weight (lbs)  2    Internal Rotation  PROM;5 reps;Strengthening;10 reps    Internal Rotation Weight (lbs)  2    Flexion  PROM;5 reps;Strengthening;10 reps    Shoulder Flexion Weight (lbs)  2    ABduction  PROM;5 reps;Strengthening;10 reps    Shoulder ABduction Weight (lbs)  2      Shoulder Exercises: Standing   Protraction  Strengthening;10 reps    Protraction Weight (lbs)  2    Horizontal ABduction  Strengthening;10 reps    Horizontal ABduction Weight (lbs)  2    External Rotation  Strengthening;10 reps    External Rotation Weight (lbs)  2    Internal Rotation  Strengthening;10 reps    Internal Rotation Weight (lbs)  2    Flexion  Strengthening;10 reps    Shoulder Flexion Weight (lbs)  2    ABduction  Strengthening;10 reps    Shoulder ABduction Weight (lbs)  1    Extension  Theraband;15 reps    Theraband Level (Shoulder Extension)  Level 2 (Red)    Row  Theraband;15 reps  Theraband Level (Shoulder Row)  Level 2 (Red)    Retraction  Theraband;15 reps    Theraband Level (Shoulder Retraction)  Level 2 (Red)      Shoulder Exercises: Therapy Ball   Other Therapy Ball Exercises  green therapy ball; Chest press, flexion, diagonals, 10X      Shoulder Exercises: ROM/Strengthening   UBE (Upper Arm Bike)  Level 1 2' forward 2' reverse    pace: 6.0-7.0   Over Head Lace  2' seated    X to V Arms  15X with no weight    Proximal Shoulder Strengthening, Supine  10X with 2# no rest breaks    Proximal Shoulder Strengthening, Seated  15X with 1# no rest breaks    Ball on Wall  1' flexion 1' abduction green ball    Other ROM/Strengthening Exercises  red band loop: Wall slide with Y arm: 10X; wall walks: 8X up;               OT Short Term Goals - 04/22/19 1113      OT SHORT TERM GOAL #1   Title  Patient will be educated and independent with HEP to increase functional mobility needed to return to using her LUE at her high level of  independence for all daily tasks.    Time  3    Period  Weeks    Target Date  03/22/19      OT SHORT TERM GOAL #2   Title  Patient will increase her P/ROM of her LUE to WNL to increase ability to complete dressing tasks.    Time  3    Period  Weeks      OT SHORT TERM GOAL #3   Title  Patient will increase her LUE strength to 3/5 in order to complete activities at waist level without difficulty.    Time  3    Period  Weeks      OT SHORT TERM GOAL #4   Title  Patient will decrease her left UE fascial restrictions to min amount to increase joint mobility needed to complete functional reaching tasks.    Time  3    Period  Weeks        OT Long Term Goals - 04/17/19 1500      OT LONG TERM GOAL #1   Title  Pt will return to highest level of independence while using her LUE for all daily and work tasks.    Time  6    Period  Weeks    Status  On-going      OT LONG TERM GOAL #2   Title  Patient will increase her LUE strength 4/5 to increase her ability to complete household lifting tasks with less difficulty.    Time  6    Period  Weeks    Status  On-going      OT LONG TERM GOAL #3   Title  Patient will increase LUE A/ROM to WNL to increase ability to reach overhead and behind her back without difficulty.    Time  6    Period  Weeks    Status  On-going      OT LONG TERM GOAL #4   Title  Patient will decrease her fascial restrictions in her LUE to trace amount in order to increase joint mobility needed to complete all daily tasks.    Time  6    Period  Weeks    Status  On-going  OT LONG TERM GOAL #5   Title  Patient will report a decrease in pain of approximately 3/10 or less when using her LUE for daily tasks.    Time  6    Period  Weeks    Status  On-going            Plan - 04/22/19 1110    Clinical Impression Statement  A: Patient reports increased soreness at beginning of session. She was unable to attempt her HEP over the weekend. Able to progress to 2#  hand weights for supine and standing strengthening. Unable to complete standing abduction with 2# and utilized 1#. Pt demonstrated increased muscle fatigue during session. Increases her speed with exercises when she experiences pain even though she receives VC to slow down movement. Min fascial restrictions noted in left upper trapezius region and manual techniques were completed to address.    Body Structure / Function / Physical Skills  ADL;Muscle spasms;UE functional use;Fascial restriction;Pain;ROM;Decreased knowledge of precautions;IADL;Mobility;Strength    Plan  P: Follow up on MD appointment. Continue with strengthening. Resume sidelying strengthening.    Consulted and Agree with Plan of Care  Patient       Patient will benefit from skilled therapeutic intervention in order to improve the following deficits and impairments:   Body Structure / Function / Physical Skills: ADL, Muscle spasms, UE functional use, Fascial restriction, Pain, ROM, Decreased knowledge of precautions, IADL, Mobility, Strength       Visit Diagnosis: 1. Acute pain of left shoulder   2. Stiffness of left shoulder, not elsewhere classified   3. Other symptoms and signs involving the musculoskeletal system       Problem List Patient Active Problem List   Diagnosis Date Noted  . Postpartum care following vaginal delivery 05/04/2015  . Elevated blood pressure reading 05/02/2015  . Group B Streptococcus carrier, antepartum 04/16/2015  . Vaginal delivery 11/26/2014   Ailene Ravel, OTR/L,CBIS  [phone]  04/22/2019, 11:14 AM  [place] [address] [place], [place], [address] Phone: [phone]   Fax:  [phone]  Name: Denise Small MRN: [id number] Date of Birth: 16-Nov-1990

## 2019-04-24 ENCOUNTER — Other Ambulatory Visit: Payer: Self-pay

## 2019-04-24 ENCOUNTER — Encounter: Payer: Self-pay | Admitting: Orthopedic Surgery

## 2019-04-24 ENCOUNTER — Ambulatory Visit (INDEPENDENT_AMBULATORY_CARE_PROVIDER_SITE_OTHER): Payer: Medicaid Other | Admitting: Orthopedic Surgery

## 2019-04-24 DIAGNOSIS — S46112D Strain of muscle, fascia and tendon of long head of biceps, left arm, subsequent encounter: Secondary | ICD-10-CM

## 2019-04-24 MED ORDER — HYDROCODONE-ACETAMINOPHEN 5-325 MG PO TABS: 1.0000 | ORAL_TABLET | Freq: Every day | ORAL | 0 refills | Status: DC

## 2019-04-24 NOTE — Progress Notes (Signed)
   Post-Op Visit Note   Patient: Denise Small           Date of Birth: 11/17/1990           MRN: [id number] Visit Date: 04/24/2019 PCP: Nelwyn Salisbury, PA-C   Assessment & Plan:  Chief Complaint:  Chief Complaint  Patient presents with  . Left Shoulder - Pain   Visit Diagnoses:  1. Labral tear of long head of left biceps tendon, subsequent encounter     Plan: Rynlee is a patient is now about 10 weeks out left shoulder scope labral repair.  She had some kind of injury the last time when she was on a boat.  She got better after that but then had to pull apart some dogs were fighting and she had some pain in the shoulder at that time.  She is in physical therapy doing range of motion exercises and strengthening.  On exam she has pretty good passive range of motion with 90 degrees external rotation on the right and about 45-50 on the left.  Biceps tendon remains tenodesed.  No real evidence of instability or subluxation in the shoulder.  Plan at this time is to continue therapy for another month refill Norco x1 only with no further refills of narcotics.  She is also on naproxen Tylenol and Robaxin.  I will see her back in 4 weeks for final check. Follow-Up Instructions: Return in about 4 weeks (around 05/22/2019).   Orders:  No orders of the defined types were placed in this encounter.  Meds ordered this encounter  Medications  . HYDROcodone-acetaminophen (NORCO/VICODIN) 5-325 MG tablet    Sig: Take 1 tablet by mouth daily.    Dispense:  15 tablet    Refill:  0    Imaging: No results found.  PMFS History: Patient Active Problem List   Diagnosis Date Noted  . Postpartum care following vaginal delivery 05/04/2015  . Elevated blood pressure reading 05/02/2015  . Group B Streptococcus carrier, antepartum 04/16/2015  . Vaginal delivery 11/26/2014   Past Medical History:  Diagnosis Date  . Headache   . SVD (spontaneous vaginal delivery)    x 1    Family History  Problem  Relation Age of Onset  . Arthritis Mother   . Arthritis Father     Past Surgical History:  Procedure Laterality Date  . SHOULDER ARTHROSCOPY WITH LABRAL REPAIR Left 02/12/2019   Procedure: left shoulder arthroscopy, labral debridement and biceps tenodesis;  Surgeon: Meredith Pel, MD;  Location: [facility];  Service: Orthopedics;  Laterality: Left;  . SHOULDER SURGERY Left    Labrum repair   Social History   Occupational History  . Not on file  Tobacco Use  . Smoking status: Former Smoker    Packs/day: 0.00    Years: 0.20    Pack years: 0.00    Quit date: 12/2018    Years since quitting: 0.3  . Smokeless tobacco: Never Used  Substance and Sexual Activity  . Alcohol use: No    Frequency: Never  . Drug use: No  . Sexual activity: Yes    Birth control/protection: Pill

## 2019-04-25 ENCOUNTER — Ambulatory Visit (HOSPITAL_COMMUNITY): Payer: Medicaid Other

## 2019-04-25 ENCOUNTER — Telehealth (HOSPITAL_COMMUNITY): Payer: Self-pay | Admitting: Medical Oncology

## 2019-04-25 NOTE — Telephone Encounter (Signed)
pt called to reschedule but she didn't give a reason as to why  04/25/19

## 2019-04-29 ENCOUNTER — Telehealth (HOSPITAL_COMMUNITY): Payer: Self-pay | Admitting: Medical Oncology

## 2019-04-29 ENCOUNTER — Ambulatory Visit (HOSPITAL_COMMUNITY): Payer: Medicaid Other

## 2019-04-29 NOTE — Telephone Encounter (Signed)
04/29/19  I spoke to Denise Small and she feels like therapy is helping her and wants to come in 8/12 at next scheduled visit.  Mickel Baas will do reassess at that time.

## 2019-04-29 NOTE — Telephone Encounter (Signed)
04/29/19  pt left a message to cancel today but did want to reschedule for either 8/11 or next week and wants a call back

## 2019-05-01 ENCOUNTER — Ambulatory Visit (HOSPITAL_COMMUNITY): Payer: Medicaid Other

## 2019-05-01 ENCOUNTER — Other Ambulatory Visit: Payer: Self-pay

## 2019-05-01 ENCOUNTER — Encounter (HOSPITAL_COMMUNITY): Payer: Self-pay

## 2019-05-01 DIAGNOSIS — M25512 Pain in left shoulder: Secondary | ICD-10-CM

## 2019-05-01 DIAGNOSIS — M25612 Stiffness of left shoulder, not elsewhere classified: Secondary | ICD-10-CM

## 2019-05-01 DIAGNOSIS — R29898 Other symptoms and signs involving the musculoskeletal system: Secondary | ICD-10-CM

## 2019-05-01 NOTE — Patient Instructions (Signed)
Strengthening: Chest Pull - Resisted   Hold Theraband in front of body with hands about shoulder width a part. Pull band a part and back together slowly. Repeat __10-15__ times. Complete __1__ set(s) per session.. Repeat __1__ session(s) per day.  http://orth.exer.us/926   Copyright  VHI. All rights reserved.   PNF Strengthening: Resisted   Standing with resistive band around each hand, bring right arm up and away, thumb back. Repeat _10-15___ times per set. Do __1__ sets per session. Do ___1_ sessions per day.         Resisted External Rotation: in Neutral - Bilateral   Sit or stand, tubing in both hands, elbows at sides, bent to 90, forearms forward. Pinch shoulder blades together and rotate forearms out. Keep elbows at sides. Repeat __10-15__ times per set. Do __1__ sets per session. Do ___1_ sessions per day.  http://orth.exer.us/966   Copyright  VHI. All rights reserved.   PNF Strengthening: Resisted   Standing, hold resistive band above head. Bring right arm down and out from side. Repeat _10-15___ times per set. Do _1___ sets per session. Do ___1_ sessions per day.  http://orth.exer.us/922   Copyright  VHI. All rights reserved.  

## 2019-05-01 NOTE — Therapy (Signed)
[place], [place], [address] Phone: [phone]   Fax:  [phone]  Occupational Therapy Treatment Reassessment/discharge Patient Details  Name: Denise Small MRN: [id number] Date of Birth: [birth year] Referring Provider (OT): Dr. Marcene Duos   Encounter Date: 05/01/2019  OT End of Session - 05/01/19 0923    Visit Number  14    Number of Visits  20    Authorization Type  medicaid    Authorization Time Period  12 visits approved (03/20/19-8/11) - our of timeframe today for reassessment.    Authorization - Visit Number  9    Authorization - Number of Visits  12    OT Start Time  0840   reassessment   OT Stop Time  0915    OT Time Calculation (min)  35 min    Activity Tolerance  Patient tolerated treatment well    Behavior During Therapy  WFL for tasks assessed/performed       Past Medical History:  Diagnosis Date  . Headache   . SVD (spontaneous vaginal delivery)    x 1    Past Surgical History:  Procedure Laterality Date  . SHOULDER ARTHROSCOPY WITH LABRAL REPAIR Left 02/12/2019   Procedure: left shoulder arthroscopy, labral debridement and biceps tenodesis;  Surgeon: Meredith Pel, MD;  Location: [facility];  Service: Orthopedics;  Laterality: Left;  . SHOULDER SURGERY Left    Labrum repair    There were no vitals filed for this visit.  Subjective Assessment - 05/01/19 0921    Subjective   S: I think i'll try it on my own for 3 weeks and if the doctor wants me to come back I can.    Currently in Pain?  No/denies         [facility] OT Assessment - 05/01/19 0843      Assessment   Medical Diagnosis  Left Shoulder Pain    Referring Provider (OT)  Dr. Marcene Duos      Precautions   Precautions  Shoulder    Type of Shoulder Precautions  ROM and strengthening to pain tolerance; work on improving er      ROM / Strength   AROM / PROM / Strength  AROM;PROM;Strength      Palpation   Palpation  comment  MIn fascial restrictions noted in left upper arm, trapezius, and scapularis region.       AROM   Overall AROM Comments  assessed in seated.  external and internal rotation with shoulder adducted    AROM Assessment Site  Shoulder    Right/Left Shoulder  Left    Left Shoulder Flexion  145 Degrees   previous: 140   Left Shoulder ABduction  165 Degrees   previous: 121   Left Shoulder Internal Rotation  90 Degrees   previous: same   Left Shoulder External Rotation  60 Degrees   previous: 52 (today: 70 abducted)     PROM   Overall PROM Comments  Assessed supine. IR/er adducted    PROM Assessment Site  Shoulder    Right/Left Shoulder  Left    Left Shoulder Flexion  160 Degrees   previous: 150   Left Shoulder ABduction  180 Degrees   previous: 180   Left Shoulder Internal Rotation  90 Degrees   previous: same   Left Shoulder External Rotation  60 Degrees   previous: 40 (Today: 70 abducted)     Strength   Overall Strength Comments  assessed  in seated. IR/er adducted    Strength Assessment Site  Shoulder    Right/Left Shoulder  Left    Left Shoulder Flexion  4/5   previous: same   Left Shoulder ABduction  4+/5   previous: 4/5   Left Shoulder Internal Rotation  5/5   previous: same   Left Shoulder External Rotation  4+/5   previous: same              OT Treatments/Exercises (OP) - 05/01/19 0921      Exercises   Exercises  Shoulder      Shoulder Exercises: Seated   Horizontal ABduction  Theraband;10 reps    Theraband Level (Shoulder Horizontal ABduction)  Level 2 (Red)    External Rotation  Theraband;10 reps    Theraband Level (Shoulder External Rotation)  Level 2 (Red)    Internal Rotation  Theraband;10 reps    Theraband Level (Shoulder Internal Rotation)  Level 2 (Red)    Flexion  Theraband;10 reps    Theraband Level (Shoulder Flexion)  Level 2 (Red)    Abduction  Theraband;10 reps    Theraband Level (Shoulder ABduction)  Level 2 (Red)    Diagonals   Theraband;10 reps    Theraband Level (Shoulder Diagonals)  Level 2 (Red)             OT Education - 05/01/19 5573    Education Details  Reviewed goals and progress in therapy. Reviewed HEP and updated for appropriate exercises to work on ROM and strength.    Person(s) Educated  Patient    Methods  Explanation;Demonstration;Verbal cues;Handout    Comprehension  Verbalized understanding;Returned demonstration       OT Short Term Goals - 04/22/19 1113      OT SHORT TERM GOAL #1   Title  Patient will be educated and independent with HEP to increase functional mobility needed to return to using her LUE at her high level of independence for all daily tasks.    Time  3    Period  Weeks    Target Date  03/22/19      OT SHORT TERM GOAL #2   Title  Patient will increase her P/ROM of her LUE to WNL to increase ability to complete dressing tasks.    Time  3    Period  Weeks      OT SHORT TERM GOAL #3   Title  Patient will increase her LUE strength to 3/5 in order to complete activities at waist level without difficulty.    Time  3    Period  Weeks      OT SHORT TERM GOAL #4   Title  Patient will decrease her left UE fascial restrictions to min amount to increase joint mobility needed to complete functional reaching tasks.    Time  3    Period  Weeks        OT Long Term Goals - 05/01/19 0911      OT LONG TERM GOAL #1   Title  Pt will return to highest level of independence while using her LUE for all daily and work tasks.    Time  6    Period  Weeks    Status  Partially Met      OT LONG TERM GOAL #2   Title  Patient will increase her LUE strength 4/5 to increase her ability to complete household lifting tasks with less difficulty.    Time  6    Period  Weeks    Status  Achieved      OT LONG TERM GOAL #3   Title  Patient will increase LUE A/ROM to WNL to increase ability to reach overhead and behind her back without difficulty.    Time  6    Period  Weeks    Status   Achieved      OT LONG TERM GOAL #4   Title  Patient will decrease her fascial restrictions in her LUE to trace amount in order to increase joint mobility needed to complete all daily tasks.    Time  6    Period  Weeks    Status  Achieved      OT LONG TERM GOAL #5   Title  Patient will report a decrease in pain of approximately 3/10 or less when using her LUE for daily tasks.    Time  6    Period  Weeks    Status  Achieved            Plan - 05/01/19 2423    Clinical Impression Statement  A: reassessment completed for medicaid reauthorization. Pt reports that ROM is better although she knows it's not as good as she would like. Pain is decreased as patient only experiences a 4/10 on average during functional tasks. She continues to have deficits related to strength; more specifically flexion and external rotation. Patient demonstrates improved ROM and strength during reassessment. She demonstrated greater external rotation when her shoulder is abducted versus adducted. She is interested in discharging this date to complete her HEP independently at home. She sees the MD in 3 weeks and will return after 3 weeks if he wants her to.    Body Structure / Function / Physical Skills  ADL;Muscle spasms;UE functional use;Fascial restriction;Pain;ROM;Decreased knowledge of precautions;IADL;Mobility;Strength    Plan  P: Discharge from OT services with HEP. Follow up with MD in 3 weeks. Contact clinic if there is a need to return.    Consulted and Agree with Plan of Care  Patient       Patient will benefit from skilled therapeutic intervention in order to improve the following deficits and impairments:   Body Structure / Function / Physical Skills: ADL, Muscle spasms, UE functional use, Fascial restriction, Pain, ROM, Decreased knowledge of precautions, IADL, Mobility, Strength       Visit Diagnosis: 1. Acute pain of left shoulder   2. Stiffness of left shoulder, not elsewhere classified   3.  Other symptoms and signs involving the musculoskeletal system       Problem List Patient Active Problem List   Diagnosis Date Noted  . Postpartum care following vaginal delivery 05/04/2015  . Elevated blood pressure reading 05/02/2015  . Group B Streptococcus carrier, antepartum 04/16/2015  . Vaginal delivery 11/26/2014    OCCUPATIONAL THERAPY DISCHARGE SUMMARY  Visits from Start of Care: 14  Current functional level related to goals / functional outcomes: See above     Remaining deficits: See above   Education / Equipment: See above Plan: Patient agrees to discharge.  Patient goals were met. Patient is being discharged due to meeting the stated rehab goals.  ?????          Ailene Ravel, OTR/L,CBIS  [phone]    05/01/2019, 9:27 AM  [place] [address] [place], [place], [address] Phone: [phone]   Fax:  [phone]  Name: JERRIS KELTZ MRN: [id number] Date of Birth: 1991/01/06

## 2019-05-06 ENCOUNTER — Ambulatory Visit (HOSPITAL_COMMUNITY): Payer: Medicaid Other | Admitting: Occupational Therapy

## 2019-05-10 ENCOUNTER — Encounter

## 2019-05-22 ENCOUNTER — Ambulatory Visit (INDEPENDENT_AMBULATORY_CARE_PROVIDER_SITE_OTHER): Payer: Medicaid Other | Admitting: Orthopedic Surgery

## 2019-05-22 ENCOUNTER — Encounter: Payer: Self-pay | Admitting: Orthopedic Surgery

## 2019-05-22 DIAGNOSIS — S46112D Strain of muscle, fascia and tendon of long head of biceps, left arm, subsequent encounter: Secondary | ICD-10-CM

## 2019-05-22 MED ORDER — DICLOFENAC SODIUM 75 MG PO TBEC: DELAYED_RELEASE_TABLET | ORAL | 0 refills | Status: AC

## 2019-05-22 NOTE — Progress Notes (Signed)
   Post-Op Visit Note   Patient: Denise Small           Date of Birth: 11-Mar-1991           MRN: [id number] Visit Date: 05/22/2019 PCP: Nelwyn Salisbury, PA-C   Assessment & Plan:  Chief Complaint:  Chief Complaint  Patient presents with  . Left Shoulder - Pain   Visit Diagnoses: No diagnosis found.  Plan: Pt is a 28 y.o. Female who presents s/p L shoulder scope with labral debridement and biceps tenodesis on 02/12/19.  Pt complains of continued pain that is slowly improving.  She has been released from physical therapy in mid-August but has not been able to keep up with her HEP due to pain.  She notes pain with flexion of the elbow, worse with lifting items.  She has been taking Naproxen prn with little relief.  She has good ROM and strength on exam.  She is able to go overhead easily.  She externally rotates to 45 degrees compared to 90 degrees on the contralateral side.  Her strength and pain will continue to improve with time.  Pt will return to the office if she is not satisfied after a year s/p surgery.  Pt given tapering prescription for oral voltaren.  She will f/u prn.    Follow-Up Instructions: No follow-ups on file.   Orders:  No orders of the defined types were placed in this encounter.  Meds ordered this encounter  Medications  . diclofenac (VOLTAREN) 75 MG EC tablet    Sig: Take 1 tablet (75 mg total) by mouth 2 (two) times daily for 14 days, THEN 1 tablet (75 mg total) daily for 14 days, THEN 1 tablet (75 mg total) daily as needed for up to 21 days.    Dispense:  60 tablet    Refill:  0    Imaging: No results found.  PMFS History: Patient Active Problem List   Diagnosis Date Noted  . Postpartum care following vaginal delivery 05/04/2015  . Elevated blood pressure reading 05/02/2015  . Group B Streptococcus carrier, antepartum 04/16/2015  . Vaginal delivery 11/26/2014   Past Medical History:  Diagnosis Date  . Headache   . SVD (spontaneous vaginal  delivery)    x 1    Family History  Problem Relation Age of Onset  . Arthritis Mother   . Arthritis Father     Past Surgical History:  Procedure Laterality Date  . SHOULDER ARTHROSCOPY WITH LABRAL REPAIR Left 02/12/2019   Procedure: left shoulder arthroscopy, labral debridement and biceps tenodesis;  Surgeon: Meredith Pel, MD;  Location: [facility];  Service: Orthopedics;  Laterality: Left;  . SHOULDER SURGERY Left    Labrum repair   Social History   Occupational History  . Not on file  Tobacco Use  . Smoking status: Former Smoker    Packs/day: 0.00    Years: 0.20    Pack years: 0.00    Quit date: 12/2018    Years since quitting: 0.4  . Smokeless tobacco: Never Used  Substance and Sexual Activity  . Alcohol use: No    Frequency: Never  . Drug use: No  . Sexual activity: Yes    Birth control/protection: Pill

## 2019-05-23 ENCOUNTER — Telehealth: Payer: Self-pay | Admitting: Orthopedic Surgery

## 2019-05-23 NOTE — Telephone Encounter (Signed)
Patient and pharmacy aware PA has been approved for her medication

## 2019-05-23 NOTE — Telephone Encounter (Signed)
Patient called back and said she needed Prior Auth from the MD for the pharmacy for medications. For the new Rx written  Please call patient [phone]

## 2019-05-23 NOTE — Telephone Encounter (Signed)
Sorry to ask you to do this but can you please submit this for me on medicaid website? I am in [place] office today and do not have any of my passwords.

## 2019-05-23 NOTE — Telephone Encounter (Signed)
Tried to call patient to get clarification, no answer. LMVM advising was calling to clarify need pharmacy and advised pharmacy should have her insurance information and she should also have copy of insurance card. Also can be looked up on Medicaid website by pharmacy. Need her to clarify more specifically. Not sure if she is referring to PA that needs to be done.

## 2019-05-23 NOTE — Telephone Encounter (Signed)
Patient called stating that her pharmacy needs our office to fax over her insurance card.  CB#[phone].  Thank you.

## 2019-05-24 ENCOUNTER — Encounter: Payer: Self-pay | Admitting: Orthopedic Surgery

## 2019-05-30 ENCOUNTER — Encounter: Payer: Self-pay | Admitting: Orthopedic Surgery

## 2019-05-30 NOTE — Telephone Encounter (Signed)
Okay for both

## 2019-05-31 ENCOUNTER — Other Ambulatory Visit: Payer: Self-pay | Admitting: Orthopedic Surgery

## 2019-05-31 MED ORDER — METHOCARBAMOL 500 MG PO TABS: 500.0000 mg | ORAL_TABLET | Freq: Three times a day (TID) | ORAL | 0 refills | Status: DC | PRN

## 2019-05-31 NOTE — Telephone Encounter (Signed)
Dr Dean patient

## 2019-05-31 NOTE — Telephone Encounter (Signed)
Ok to refill 

## 2020-03-31 ENCOUNTER — Encounter (HOSPITAL_COMMUNITY): Payer: Self-pay | Admitting: Emergency Medicine

## 2020-03-31 ENCOUNTER — Other Ambulatory Visit: Payer: Self-pay

## 2020-03-31 ENCOUNTER — Emergency Department (HOSPITAL_COMMUNITY)
Admission: EM | Admit: 2020-03-31 | Discharge: 2020-03-31 | Disposition: A | Discharge status: Home / Self Care | Payer: Medicaid Other | Attending: Emergency Medicine | Admitting: Emergency Medicine

## 2020-03-31 DIAGNOSIS — O99511 Diseases of the respiratory system complicating pregnancy, first trimester: Secondary | ICD-10-CM | POA: Diagnosis not present

## 2020-03-31 DIAGNOSIS — R05 Cough: Secondary | ICD-10-CM | POA: Insufficient documentation

## 2020-03-31 DIAGNOSIS — Z87891 Personal history of nicotine dependence: Secondary | ICD-10-CM | POA: Insufficient documentation

## 2020-03-31 DIAGNOSIS — Z20822 Contact with and (suspected) exposure to covid-19: Secondary | ICD-10-CM | POA: Insufficient documentation

## 2020-03-31 DIAGNOSIS — J069 Acute upper respiratory infection, unspecified: Secondary | ICD-10-CM

## 2020-03-31 DIAGNOSIS — Z3A08 8 weeks gestation of pregnancy: Secondary | ICD-10-CM | POA: Insufficient documentation

## 2020-03-31 LAB — SARS CORONAVIRUS 2 BY RT PCR (HOSPITAL ORDER, PERFORMED IN ~~LOC~~ HOSPITAL LAB): SARS Coronavirus 2: NEGATIVE

## 2020-03-31 NOTE — ED Triage Notes (Signed)
Pt reports son was Dx with croup 1 week ago. Pt here for cough, congestion, and fever. Pt [redacted] weeks pregnant.

## 2020-03-31 NOTE — Discharge Instructions (Addendum)
Drink plenty of fluids and get plenty of rest.  Take Tylenol 1000 mg every 6 hours as needed for pain or fever.  Isolate at home until the results of your Covid test are known.  This should be later this evening or tomorrow.      Person Under Monitoring Name: Denise Small  Location: [address] [place] [address]   Infection Prevention Recommendations for Individuals Confirmed to have, or Being Evaluated for, 2019 Novel Coronavirus (COVID-19) Infection Who Receive Care at Home  Individuals who are confirmed to have, or are being evaluated for, COVID-19 should follow the prevention steps below until a healthcare provider or local or state health department says they can return to normal activities.  Stay home except to get medical care You should restrict activities outside your home, except for getting medical care. Do not go to work, school, or public areas, and do not use public transportation or taxis.  Call ahead before visiting your doctor Before your medical appointment, call the healthcare provider and tell them that you have, or are being evaluated for, COVID-19 infection. This will help the healthcare provider's office take steps to keep other people from getting infected. Ask your healthcare provider to call the local or state health department.  Monitor your symptoms Seek prompt medical attention if your illness is worsening (e.g., difficulty breathing). Before going to your medical appointment, call the healthcare provider and tell them that you have, or are being evaluated for, COVID-19 infection. Ask your healthcare provider to call the local or state health department.  Wear a facemask You should wear a facemask that covers your nose and mouth when you are in the same room with other people and when you visit a healthcare provider. People who live with or visit you should also wear a facemask while they are in the same room with you.  Separate  yourself from other people in your home As much as possible, you should stay in a different room from other people in your home. Also, you should use a separate bathroom, if available.  Avoid sharing household items You should not share dishes, drinking glasses, cups, eating utensils, towels, bedding, or other items with other people in your home. After using these items, you should wash them thoroughly with soap and water.  Cover your coughs and sneezes Cover your mouth and nose with a tissue when you cough or sneeze, or you can cough or sneeze into your sleeve. Throw used tissues in a lined trash can, and immediately wash your hands with soap and water for at least 20 seconds or use an alcohol-based hand rub.  Wash your [employer] your hands often and thoroughly with soap and water for at least 20 seconds. You can use an alcohol-based hand sanitizer if soap and water are not available and if your hands are not visibly dirty. Avoid touching your eyes, nose, and mouth with unwashed hands.   Prevention Steps for Caregivers and Household Members of Individuals Confirmed to have, or Being Evaluated for, COVID-19 Infection Being Cared for in the Home  If you live with, or provide care at home for, a person confirmed to have, or being evaluated for, COVID-19 infection please follow these guidelines to prevent infection:  Follow healthcare provider's instructions Make sure that you understand and can help the patient follow any healthcare provider instructions for all care.  Provide for the patient's basic needs You should help the patient with basic needs in the home  and provide support for getting groceries, prescriptions, and other personal needs.  Monitor the patient's symptoms If they are getting sicker, call his or her medical provider and tell them that the patient has, or is being evaluated for, COVID-19 infection. This will help the healthcare provider's office take steps to keep  other people from getting infected. Ask the healthcare provider to call the local or state health department.  Limit the number of people who have contact with the patient If possible, have only one caregiver for the patient. Other household members should stay in another home or place of residence. If this is not possible, they should stay in another room, or be separated from the patient as much as possible. Use a separate bathroom, if available. Restrict visitors who do not have an essential need to be in the home.  Keep older adults, very young children, and other sick people away from the patient Keep older adults, very young children, and those who have compromised immune systems or chronic health conditions away from the patient. This includes people with chronic heart, lung, or kidney conditions, diabetes, and cancer.  Ensure good ventilation Make sure that shared spaces in the home have good air flow, such as from an air conditioner or an opened window, weather permitting.  Wash your hands often Wash your hands often and thoroughly with soap and water for at least 20 seconds. You can use an alcohol based hand sanitizer if soap and water are not available and if your hands are not visibly dirty. Avoid touching your eyes, nose, and mouth with unwashed hands. Use disposable paper towels to dry your hands. If not available, use dedicated cloth towels and replace them when they become wet.  Wear a facemask and gloves Wear a disposable facemask at all times in the room and gloves when you touch or have contact with the patient's blood, body fluids, and/or secretions or excretions, such as sweat, saliva, sputum, nasal mucus, vomit, urine, or feces.  Ensure the mask fits over your nose and mouth tightly, and do not touch it during use. Throw out disposable facemasks and gloves after using them. Do not reuse. Wash your hands immediately after removing your facemask and gloves. If your  personal clothing becomes contaminated, carefully remove clothing and launder. Wash your hands after handling contaminated clothing. Place all used disposable facemasks, gloves, and other waste in a lined container before disposing them with other household waste. Remove gloves and wash your hands immediately after handling these items.  Do not share dishes, glasses, or other household items with the patient Avoid sharing household items. You should not share dishes, drinking glasses, cups, eating utensils, towels, bedding, or other items with a patient who is confirmed to have, or being evaluated for, COVID-19 infection. After the person uses these items, you should wash them thoroughly with soap and water.  Wash laundry thoroughly Immediately remove and wash clothes or bedding that have blood, body fluids, and/or secretions or excretions, such as sweat, saliva, sputum, nasal mucus, vomit, urine, or feces, on them. Wear gloves when handling laundry from the patient. Read and follow directions on labels of laundry or clothing items and detergent. In general, wash and dry with the warmest temperatures recommended on the label.  Clean all areas the individual has used often Clean all touchable surfaces, such as counters, tabletops, doorknobs, bathroom fixtures, toilets, phones, keyboards, tablets, and bedside tables, every day. Also, clean any surfaces that may have blood, body fluids, and/or secretions  or excretions on them. Wear gloves when cleaning surfaces the patient has come in contact with. Use a diluted bleach solution (e.g., dilute bleach with 1 part bleach and 10 parts water) or a household disinfectant with a label that says EPA-registered for coronaviruses. To make a bleach solution at home, add 1 tablespoon of bleach to 1 quart (4 cups) of water. For a larger supply, add  cup of bleach to 1 gallon (16 cups) of water. Read labels of cleaning products and follow recommendations provided on  product labels. Labels contain instructions for safe and effective use of the cleaning product including precautions you should take when applying the product, such as wearing gloves or eye protection and making sure you have good ventilation during use of the product. Remove gloves and wash hands immediately after cleaning.  Monitor yourself for signs and symptoms of illness Caregivers and household members are considered close contacts, should monitor their health, and will be asked to limit movement outside of the home to the extent possible. Follow the monitoring steps for close contacts listed on the symptom monitoring form.   ? If you have additional questions, contact your local health department or call the epidemiologist on call at [phone] (available 24/7). ? This guidance is subject to change. For the most up-to-date guidance from [facility] [address], please refer to their website: [email or web address]hu

## 2020-03-31 NOTE — ED Provider Notes (Signed)
[facility] EMERGENCY DEPARTMENT Provider Note   CSN: [id number] Arrival date & time: 03/31/20  1758     History Chief Complaint  Patient presents with  . Cough    Denise Small is a 29 y.o. female.  Patient is a 29 year old female with no significant past medical history.  She presents with a 5-day history of congestion, cough, and not feeling well.  Her son was diagnosed with croup 1 week ago and her symptoms began days later.  Patient does report some productive cough, however no chest pain or shortness of breath.  She is currently [redacted] weeks pregnant.  She has not been vaccinated for COVID-19.  The history is provided by the patient.       Past Medical History:  Diagnosis Date  . Headache   . SVD (spontaneous vaginal delivery)    x 1    Patient Active Problem List   Diagnosis Date Noted  . Postpartum care following vaginal delivery 05/04/2015  . Elevated blood pressure reading 05/02/2015  . Group B Streptococcus carrier, antepartum 04/16/2015  . Vaginal delivery 11/26/2014    Past Surgical History:  Procedure Laterality Date  . SHOULDER ARTHROSCOPY WITH LABRAL REPAIR Left 02/12/2019   Procedure: left shoulder arthroscopy, labral debridement and biceps tenodesis;  Surgeon: Cammy Copa, MD;  Location: [facility] OR;  Service: Orthopedics;  Laterality: Left;  . SHOULDER SURGERY Left    Labrum repair     OB History    Gravida  1   Para      Term      Preterm      AB      Living        SAB      TAB      Ectopic      Multiple      Live Births              Family History  Problem Relation Age of Onset  . Arthritis Mother   . Arthritis Father     Social History   Tobacco Use  . Smoking status: Former Smoker    Packs/day: 0.00    Years: 0.20    Pack years: 0.00    Quit date: 12/2018    Years since quitting: 1.2  . Smokeless tobacco: Never Used  Vaping Use  . Vaping Use: Never used  Substance Use Topics  . Alcohol use: No  . Drug  use: No    Home Medications Prior to Admission medications   Medication Sig Start Date End Date Taking? Authorizing Provider  HYDROcodone-acetaminophen (NORCO/VICODIN) 5-325 MG tablet 1 po q 6hrs prn pain 04/17/19   Magnant, Charles L, PA-C  HYDROcodone-acetaminophen (NORCO/VICODIN) 5-325 MG tablet Take 1 tablet by mouth daily. 04/24/19   Cammy Copa, MD  methocarbamol (ROBAXIN) 500 MG tablet Take 1 tablet (500 mg total) by mouth every 8 (eight) hours as needed for muscle spasms. 05/31/19   Cammy Copa, MD  naproxen (NAPROSYN) 500 MG tablet Take 1 tablet (500 mg total) by mouth 2 (two) times daily with a meal. Patient taking differently: Take 500 mg by mouth 2 (two) times daily as needed (pain).  10/31/18   Darreld Mclean, MD  Norethin-Eth Estradiol-Fe (GENERESS FE PO) Take by mouth 1 day or 1 dose.    [provider]  oxyCODONE (OXY IR/ROXICODONE) 5 MG immediate release tablet 1 po BID prn pain 03/06/19   Cammy Copa, MD    Allergies  Sulfa antibiotics  Review of Systems   Review of Systems  All other systems reviewed and are negative.   Physical Exam Updated Vital Signs BP 132/89   Pulse 70   Temp 98.5 F (36.9 C)   Resp 20   SpO2 100%   Physical Exam Vitals and nursing note reviewed.  Constitutional:      General: She is not in acute distress.    Appearance: She is well-developed. She is not diaphoretic.  HENT:     Head: Normocephalic and atraumatic.  Cardiovascular:     Rate and Rhythm: Normal rate and regular rhythm.     Heart sounds: No murmur heard.  No friction rub. No gallop.   Pulmonary:     Effort: Pulmonary effort is normal. No respiratory distress.     Breath sounds: Normal breath sounds. No wheezing.  Abdominal:     General: Bowel sounds are normal. There is no distension.     Palpations: Abdomen is soft.     Tenderness: There is no abdominal tenderness.  Musculoskeletal:        General: Normal range of motion.      Cervical back: Normal range of motion and neck supple.  Skin:    General: Skin is warm and dry.  Neurological:     Mental Status: She is alert and oriented to person, place, and time.     ED Results / Procedures / Treatments   Labs (all labs ordered are listed, but only abnormal results are displayed) Labs Reviewed  SARS CORONAVIRUS 2 BY RT PCR (HOSPITAL ORDER, PERFORMED IN [facility] LAB)    EKG None  Radiology No results found.  Procedures Procedures (including critical care time)  Medications Ordered in ED Medications - No data to display  ED Course  I have reviewed the triage vital signs and the nursing notes.  Pertinent labs & imaging results that were available during my care of the patient were reviewed by me and considered in my medical decision making (see chart for details).    MDM Rules/Calculators/A&P  Patient presenting with URI-like symptoms.  This has been ongoing for the past 5 days.  Her vitals are stable and oxygen saturations are 100%.  Lungs are clear to auscultation and patient appears clinically well.  I suspect a viral etiology, possibly Covid and test will be obtained.  Either way, patient recommended to take Tylenol, drink plenty of fluids, and return as needed if her breathing worsens.  Final Clinical Impression(s) / ED Diagnoses Final diagnoses:  None    Rx / DC Orders ED Discharge Orders    None       Geoffery Lyons, MD 03/31/20 2052

## 2020-04-30 DIAGNOSIS — Z3490 Encounter for supervision of normal pregnancy, unspecified, unspecified trimester: Secondary | ICD-10-CM | POA: Insufficient documentation

## 2020-05-11 LAB — OB RESULTS CONSOLE HEPATITIS B SURFACE ANTIGEN: Hepatitis B Surface Ag: NEGATIVE

## 2020-05-11 LAB — OB RESULTS CONSOLE HIV ANTIBODY (ROUTINE TESTING): HIV: NONREACTIVE

## 2020-05-11 LAB — OB RESULTS CONSOLE GC/CHLAMYDIA
Chlamydia: NEGATIVE
Gonorrhea: NEGATIVE

## 2020-05-11 LAB — OB RESULTS CONSOLE VARICELLA ZOSTER ANTIBODY, IGG: Varicella: IMMUNE

## 2020-05-11 LAB — OB RESULTS CONSOLE RPR: RPR: NONREACTIVE

## 2020-05-11 LAB — OB RESULTS CONSOLE RUBELLA ANTIBODY, IGM: Rubella: IMMUNE

## 2020-07-06 IMAGING — MR MR SHOULDER*L* W/O CM
4 of 6 series · 19 of 40 positions shown · non-contrast
Comparison: Left shoulder x-rays dated October 31, 2018.

CLINICAL DATA: Chronic left shoulder pain. Prior surgery in 1283
for Lonaly labral repair.

EXAM:
MRI OF THE LEFT SHOULDER WITHOUT CONTRAST
TECHNIQUE: Multiplanar, multisequence MR imaging of the shoulder was performed.
No intravenous contrast was administered.

[Series 4: T1 · oblique · 4.0mm · 0.23mm/px · 6 of 20 slices shown]
[im 1/20]
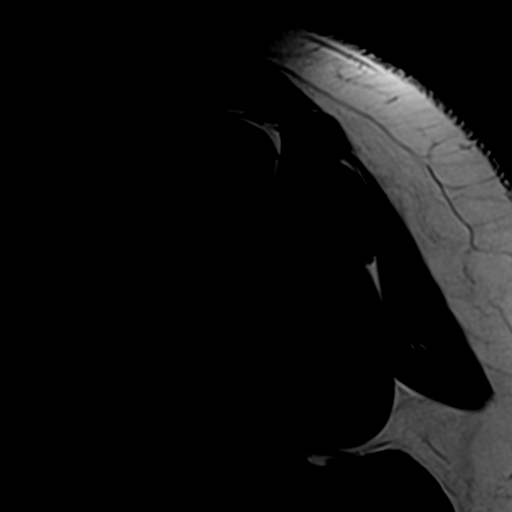
[im 4/20]
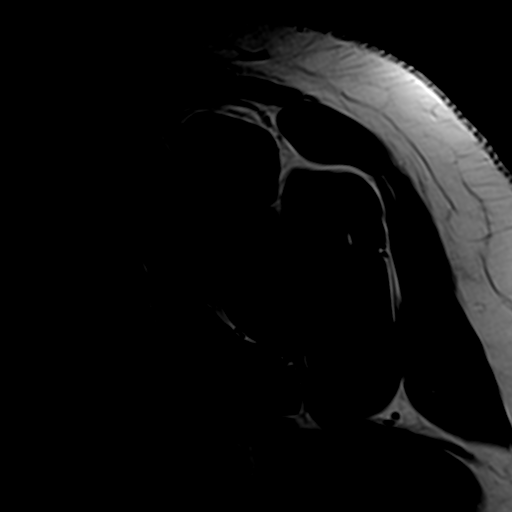
[im 8/20]
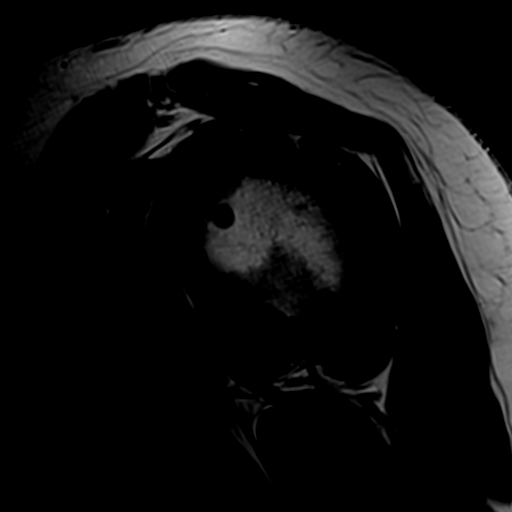
[im 12/20]
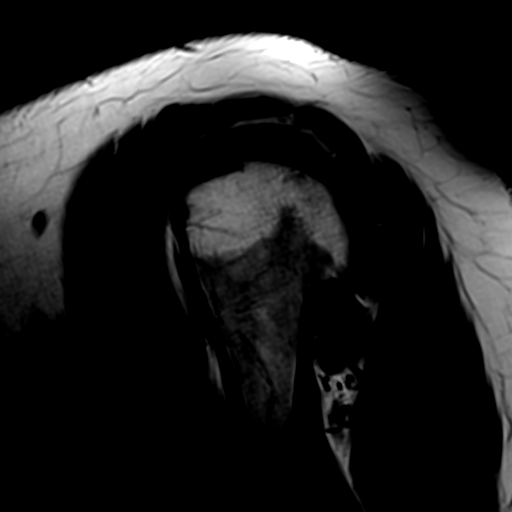
[im 16/20]
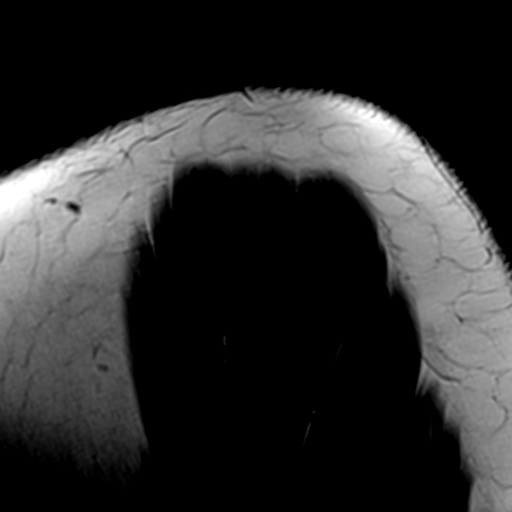
[im 20/20]
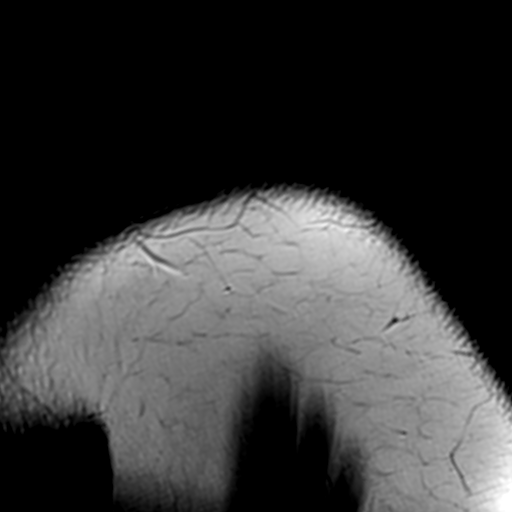

[Series 5: t2fs sagital · oblique · 4.0mm · 0.24mm/px · 6 of 20 slices shown]
[im 1/20]
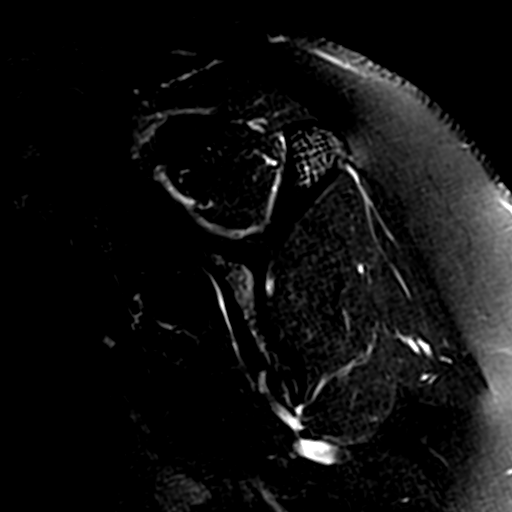
[im 4/20]
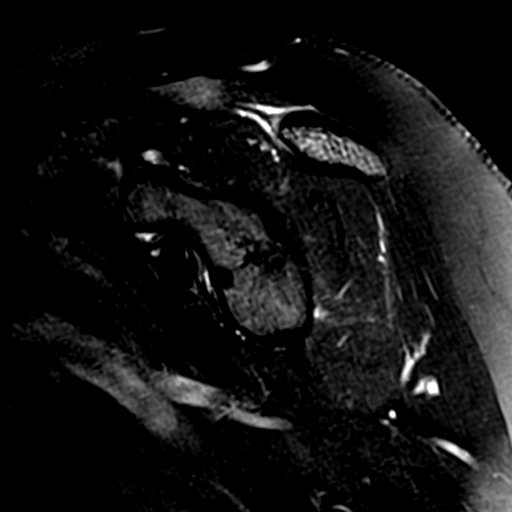
[im 8/20]
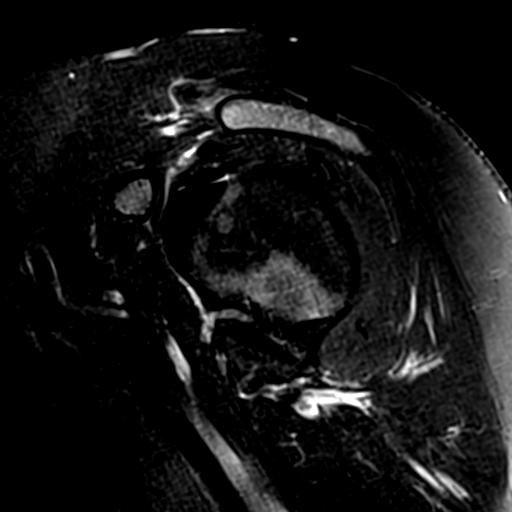
[im 12/20]
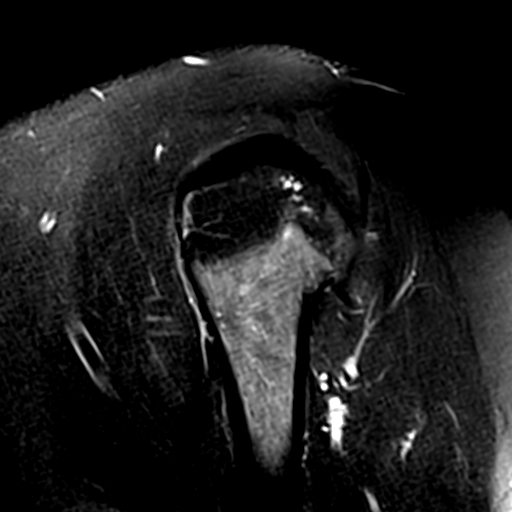
[im 16/20]
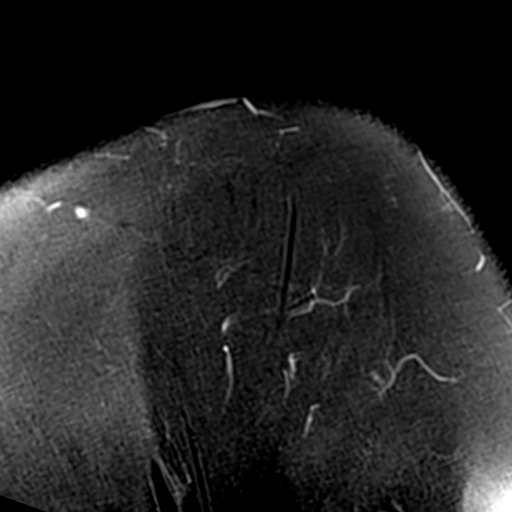
[im 20/20]
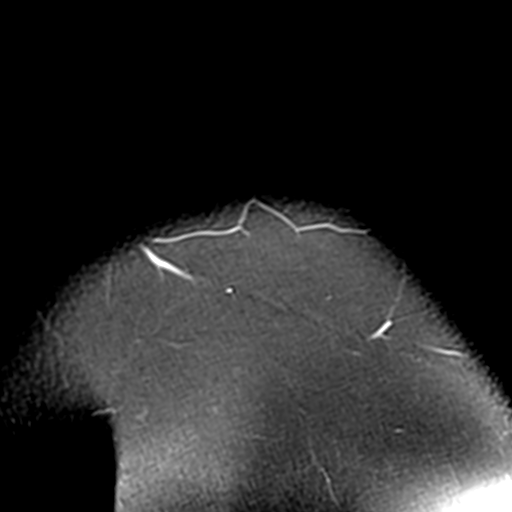

[Series 6: t2fs coronal · coronal · 4.0mm · 0.25mm/px · 4 of 20 slices shown]
[im 1/20]
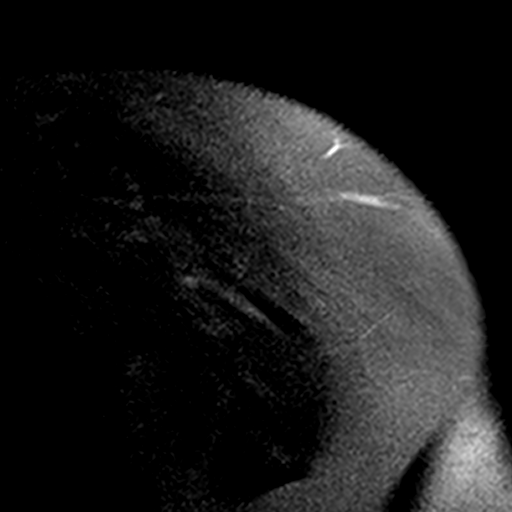
[im 4/20]
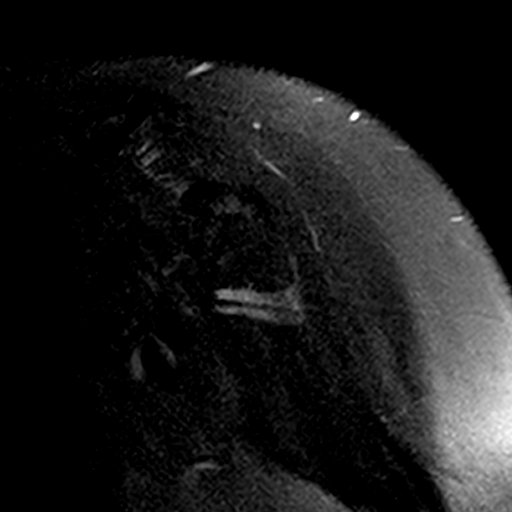
[im 10/20]
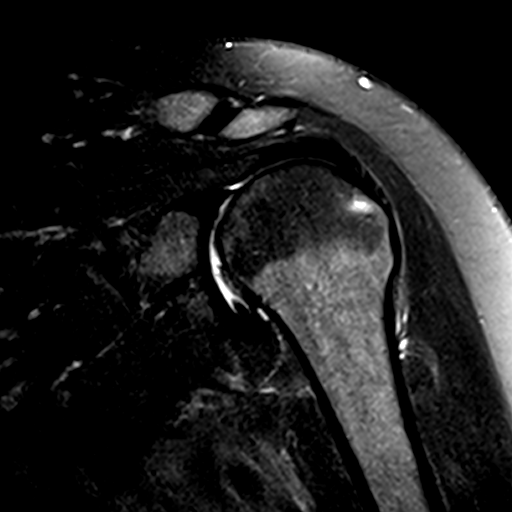
[im 16/20]
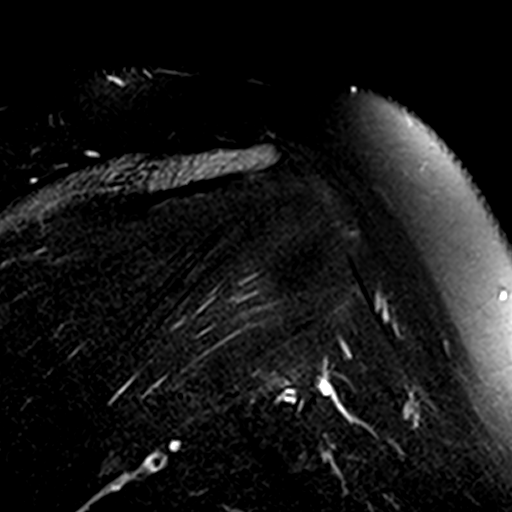

[Series 7: pdfs coronal · coronal · 4.0mm · 0.25mm/px · 3 of 20 slices shown]
[im 4/20]
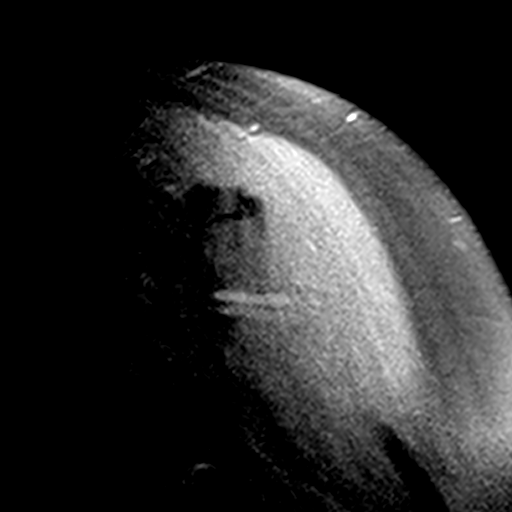
[im 10/20]
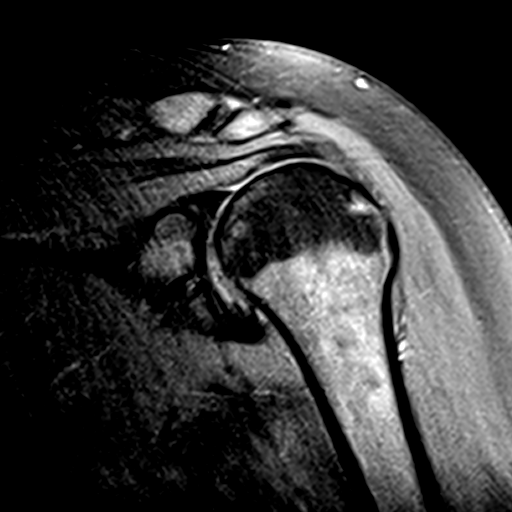
[im 16/20]
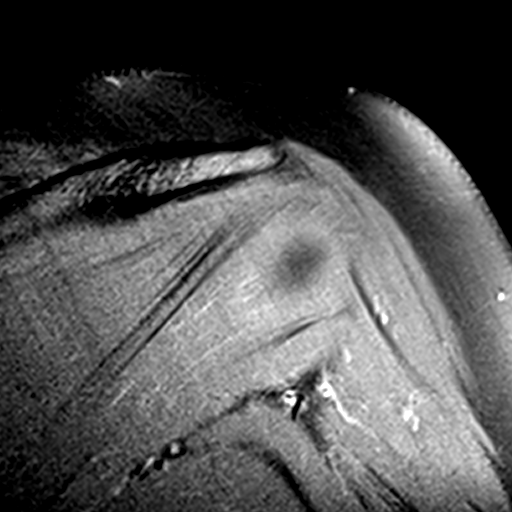

[19 of 40 positions shown; findings below may reference images not displayed]

FINDINGS: Rotator cuff:  Intact rotator cuff.

Muscles: No atrophy or abnormal signal of the muscles of the rotator
cuff.

Biceps long head:  Intact and normally positioned.

Acromioclavicular Joint: Normal acromioclavicular joint. Type II
acromion. No subacromial/subdeltoid bursal fluid.

Glenohumeral Joint: No joint effusion. No chondral defect.

Labrum: Postsurgical changes of the anterior labrum with a suture
anchor in the glenoid. There is a small amount of signal extending
into the anterior labrum (series 8, image 12). Additionally, there
is a 5 mm area of loculated fluid signal along the anterior glenoid
(series 8, image 13).

Bones: No marrow abnormality, fracture or dislocation.
Susceptibility artifact near the posterior greater tuberosity.

Other: None.
IMPRESSION: 1. Evidence of prior anterior labral repair with suspected recurrent
small anterior labral tear. This may be better evaluated with
dedicated MR arthrography.

## 2020-09-19 NOTE — L&D Delivery Note (Signed)
Date of delivery: 11/12/2020 Estimated Date of Delivery: 11/11/20 Patient's last menstrual period was 02/05/2020. EGA: [redacted]w[redacted]d  Delivery Note At 6:41 PM a viable female was delivered via Vaginal, Spontaneous (Presentation: direct OA).  APGAR: 9, 10; weight 8 lb 3.6 oz (3730 g).   Placenta status: Spontaneous, Intact.  Cord: 3 vessels with the following complications: None.  Cord pH: not collected  Anesthesia: None Episiotomy: None Lacerations: Periurethral Suture Repair: 4.0 vicryl Est. Blood Loss (mL):  200cc   Mom presented to L&D with labor, AROM'd. Precipitously progressed to complete, second stage: 2 minutes.  delivery of fetal head with restitution to ROT.   Anterior then posterior shoulders delivered without difficulty.  Baby placed on mom's chest, and attended to by peds.  We sang happy birthday to baby Denise Small.  Cord was then clamped and cut when pulseless.  Placenta spontaneously delivered, intact.   IV pitocin given for hemorrhage prophylaxis. Periurethral tear repaired via mattress suture with 4-0 vicryl.   Mom to postpartum.  Baby to Couplet care / Skin to Skin.  Denise Small 11/12/2020, 8:55 PM

## 2020-10-15 LAB — OB RESULTS CONSOLE GC/CHLAMYDIA
Chlamydia: NEGATIVE
Gonorrhea: NEGATIVE

## 2020-10-15 LAB — OB RESULTS CONSOLE GBS: GBS: NEGATIVE

## 2020-10-15 LAB — OB RESULTS CONSOLE HIV ANTIBODY (ROUTINE TESTING): HIV: NONREACTIVE

## 2020-10-15 LAB — OB RESULTS CONSOLE RPR: RPR: NONREACTIVE

## 2020-11-12 ENCOUNTER — Inpatient Hospital Stay
Admission: EM | Admit: 2020-11-12 | Discharge: 2020-11-14 | DRG: 806 | Disposition: A | Discharge status: Home / Self Care | Payer: Medicaid Other | Attending: Obstetrics & Gynecology | Admitting: Obstetrics & Gynecology

## 2020-11-12 ENCOUNTER — Encounter: Payer: Self-pay | Admitting: Obstetrics and Gynecology

## 2020-11-12 ENCOUNTER — Other Ambulatory Visit: Payer: Self-pay

## 2020-11-12 ENCOUNTER — Encounter: Payer: Self-pay | Admitting: Anesthesiology

## 2020-11-12 DIAGNOSIS — D62 Acute posthemorrhagic anemia: Secondary | ICD-10-CM | POA: Diagnosis not present

## 2020-11-12 DIAGNOSIS — O9081 Anemia of the puerperium: Secondary | ICD-10-CM | POA: Diagnosis not present

## 2020-11-12 DIAGNOSIS — Z20822 Contact with and (suspected) exposure to covid-19: Secondary | ICD-10-CM | POA: Diagnosis present

## 2020-11-12 DIAGNOSIS — Z87891 Personal history of nicotine dependence: Secondary | ICD-10-CM | POA: Diagnosis not present

## 2020-11-12 DIAGNOSIS — O479 False labor, unspecified: Secondary | ICD-10-CM | POA: Diagnosis present

## 2020-11-12 DIAGNOSIS — Z8616 Personal history of COVID-19: Secondary | ICD-10-CM

## 2020-11-12 DIAGNOSIS — O99214 Obesity complicating childbirth: Secondary | ICD-10-CM | POA: Diagnosis present

## 2020-11-12 DIAGNOSIS — O99824 Streptococcus B carrier state complicating childbirth: Principal | ICD-10-CM | POA: Diagnosis present

## 2020-11-12 DIAGNOSIS — O26893 Other specified pregnancy related conditions, third trimester: Secondary | ICD-10-CM | POA: Diagnosis present

## 2020-11-12 DIAGNOSIS — Z3A40 40 weeks gestation of pregnancy: Secondary | ICD-10-CM | POA: Diagnosis not present

## 2020-11-12 LAB — RESP PANEL BY RT-PCR (FLU A&B, COVID) ARPGX2
Influenza A by PCR: NEGATIVE
Influenza B by PCR: NEGATIVE
SARS Coronavirus 2 by RT PCR: NEGATIVE

## 2020-11-12 LAB — ABO/RH: ABO/RH(D): A POS

## 2020-11-12 LAB — CBC
HCT: 33.1 % — ABNORMAL LOW (ref 36.0–46.0)
Hemoglobin: 11.4 g/dL — ABNORMAL LOW (ref 12.0–15.0)
MCH: 27.5 pg (ref 26.0–34.0)
MCHC: 34.4 g/dL (ref 30.0–36.0)
MCV: 79.8 fL — ABNORMAL LOW (ref 80.0–100.0)
Platelets: 202 10*3/uL (ref 150–400)
RBC: 4.15 MIL/uL (ref 3.87–5.11)
RDW: 13.3 % (ref 11.5–15.5)
WBC: 10.2 10*3/uL (ref 4.0–10.5)
nRBC: 0 % (ref 0.0–0.2)

## 2020-11-12 LAB — TYPE AND SCREEN
ABO/RH(D): A POS
Antibody Screen: NEGATIVE

## 2020-11-12 MED ORDER — SIMETHICONE 80 MG PO CHEW: 80.0000 mg | CHEWABLE_TABLET | ORAL | Status: DC | PRN

## 2020-11-12 MED ORDER — OXYTOCIN-SODIUM CHLORIDE 30-0.9 UT/500ML-% IV SOLN: 1.0000 m[IU]/min | INTRAVENOUS | Status: DC

## 2020-11-12 MED ORDER — BENZOCAINE-MENTHOL 20-0.5 % EX AERO
1.0000 "application " | INHALATION_SPRAY | CUTANEOUS | Status: DC | PRN
  Administered 2020-11-12: 1 via TOPICAL
  Filled 2020-11-12: qty 56

## 2020-11-12 MED ORDER — FENTANYL 2.5 MCG/ML W/ROPIVACAINE 0.15% IN NS 100 ML EPIDURAL (ARMC)
EPIDURAL | Status: AC
  Filled 2020-11-12: qty 100

## 2020-11-12 MED ORDER — HYDROCORTISONE (PERIANAL) 2.5 % EX CREA
TOPICAL_CREAM | CUTANEOUS | Status: DC
  Filled 2020-11-12: qty 28.35

## 2020-11-12 MED ORDER — CALCIUM CARBONATE ANTACID 500 MG PO CHEW: 2.0000 | CHEWABLE_TABLET | ORAL | Status: DC | PRN

## 2020-11-12 MED ORDER — DOCUSATE SODIUM 100 MG PO CAPS
100.0000 mg | ORAL_CAPSULE | Freq: Two times a day (BID) | ORAL | Status: DC
  Administered 2020-11-13 – 2020-11-14 (×3): 100 mg via ORAL
  Filled 2020-11-12 (×3): qty 1

## 2020-11-12 MED ORDER — FENTANYL 2.5 MCG/ML W/ROPIVACAINE 0.15% IN NS 100 ML EPIDURAL (ARMC): 12.0000 mL/h | EPIDURAL | Status: DC

## 2020-11-12 MED ORDER — OXYTOCIN 10 UNIT/ML IJ SOLN
INTRAMUSCULAR | Status: AC
  Filled 2020-11-12: qty 2

## 2020-11-12 MED ORDER — ACETAMINOPHEN 500 MG PO TABS: 1000.0000 mg | ORAL_TABLET | Freq: Four times a day (QID) | ORAL | Status: DC | PRN

## 2020-11-12 MED ORDER — EPHEDRINE 5 MG/ML INJ
10.0000 mg | INTRAVENOUS | Status: DC | PRN
  Filled 2020-11-12: qty 2

## 2020-11-12 MED ORDER — ONDANSETRON HCL 4 MG/2ML IJ SOLN: 4.0000 mg | INTRAMUSCULAR | Status: DC | PRN

## 2020-11-12 MED ORDER — PHENYLEPHRINE 40 MCG/ML (10ML) SYRINGE FOR IV PUSH (FOR BLOOD PRESSURE SUPPORT)
80.0000 ug | PREFILLED_SYRINGE | INTRAVENOUS | Status: DC | PRN
  Filled 2020-11-12: qty 10

## 2020-11-12 MED ORDER — IBUPROFEN 600 MG PO TABS
600.0000 mg | ORAL_TABLET | Freq: Four times a day (QID) | ORAL | Status: DC
  Administered 2020-11-13: 600 mg via ORAL
  Filled 2020-11-12: qty 1

## 2020-11-12 MED ORDER — ONDANSETRON HCL 4 MG PO TABS: 4.0000 mg | ORAL_TABLET | ORAL | Status: DC | PRN

## 2020-11-12 MED ORDER — OXYTOCIN BOLUS FROM INFUSION
333.0000 mL | Freq: Once | INTRAVENOUS | Status: AC
  Administered 2020-11-12: 333 mL via INTRAVENOUS

## 2020-11-12 MED ORDER — DIPHENHYDRAMINE HCL 50 MG/ML IJ SOLN: 12.5000 mg | INTRAMUSCULAR | Status: DC | PRN

## 2020-11-12 MED ORDER — SODIUM CHLORIDE 0.9% FLUSH: 3.0000 mL | INTRAVENOUS | Status: DC | PRN

## 2020-11-12 MED ORDER — WITCH HAZEL-GLYCERIN EX PADS
1.0000 "application " | MEDICATED_PAD | CUTANEOUS | Status: DC
  Administered 2020-11-12: 1 via TOPICAL
  Filled 2020-11-12: qty 100

## 2020-11-12 MED ORDER — LACTATED RINGERS IV SOLN: 500.0000 mL | Freq: Once | INTRAVENOUS | Status: DC

## 2020-11-12 MED ORDER — BUTORPHANOL TARTRATE 1 MG/ML IJ SOLN: 1.0000 mg | INTRAMUSCULAR | Status: DC | PRN

## 2020-11-12 MED ORDER — SODIUM CHLORIDE 0.9% FLUSH: 3.0000 mL | Freq: Two times a day (BID) | INTRAVENOUS | Status: DC

## 2020-11-12 MED ORDER — LIDOCAINE HCL (PF) 1 % IJ SOLN
30.0000 mL | INTRAMUSCULAR | Status: DC | PRN
Start: 2020-11-12 — End: 2020-11-12

## 2020-11-12 MED ORDER — DIPHENHYDRAMINE HCL 25 MG PO CAPS: 25.0000 mg | ORAL_CAPSULE | Freq: Four times a day (QID) | ORAL | Status: DC | PRN

## 2020-11-12 MED ORDER — COCONUT OIL OIL
1.0000 | TOPICAL_OIL | Status: DC | PRN
Start: 2020-11-12 — End: 2020-11-14
  Filled 2020-11-12: qty 120

## 2020-11-12 MED ORDER — PRENATAL MULTIVITAMIN CH
1.0000 | ORAL_TABLET | Freq: Every day | ORAL | Status: DC
  Administered 2020-11-13 – 2020-11-14 (×2): 1 via ORAL
  Filled 2020-11-12 (×2): qty 1

## 2020-11-12 MED ORDER — LACTATED RINGERS IV SOLN: 500.0000 mL | INTRAVENOUS | Status: DC | PRN

## 2020-11-12 MED ORDER — OXYTOCIN-SODIUM CHLORIDE 30-0.9 UT/500ML-% IV SOLN
2.5000 [IU]/h | INTRAVENOUS | Status: DC
  Filled 2020-11-12: qty 1000

## 2020-11-12 MED ORDER — MISOPROSTOL 200 MCG PO TABS
ORAL_TABLET | ORAL | Status: AC
  Filled 2020-11-12: qty 4

## 2020-11-12 MED ORDER — TERBUTALINE SULFATE 1 MG/ML IJ SOLN: 0.2500 mg | Freq: Once | INTRAMUSCULAR | Status: DC | PRN

## 2020-11-12 MED ORDER — ONDANSETRON HCL 4 MG/2ML IJ SOLN: 4.0000 mg | Freq: Four times a day (QID) | INTRAMUSCULAR | Status: DC | PRN

## 2020-11-12 MED ORDER — AMMONIA AROMATIC IN INHA
RESPIRATORY_TRACT | Status: AC
  Filled 2020-11-12: qty 10

## 2020-11-12 MED ORDER — SODIUM CHLORIDE 0.9 % IV SOLN: 250.0000 mL | INTRAVENOUS | Status: DC | PRN

## 2020-11-12 MED ORDER — LACTATED RINGERS IV SOLN: INTRAVENOUS | Status: DC

## 2020-11-12 MED ORDER — LIDOCAINE HCL (PF) 1 % IJ SOLN
INTRAMUSCULAR | Status: AC
  Filled 2020-11-12: qty 30

## 2020-11-12 NOTE — Discharge Summary (Addendum)
Obstetrical Discharge Summary  Patient Name: Denise Small DOB: April 06, 1991 MRN: [id number]  Date of Admission: 11/12/2020 Date of Delivery:11/12/2020 Delivered by: Ranae Plumber, MD Date of Discharge: 11/14/2020  Primary OB: [facility]  QTM:[id number] last menstrual period was 02/05/2020. EDC Estimated Date of Delivery: 11/11/20 Gestational Age at Delivery: [redacted]w[redacted]d   Antepartum complications:   1. Obesity BMI 37 2. COVID in pregnancy   Admitting Diagnosis:  labor Secondary Diagnosis: Patient Active Problem List   Diagnosis Date Noted  . NSVD (normal spontaneous vaginal delivery) 11/14/2020  . Acute blood loss anemia 11/14/2020  . Encounter for supervision of normal pregnancy 04/30/2020  . Elevated blood pressure reading 05/02/2015  . Group B Streptococcus carrier, antepartum 04/16/2015    Augmentation: AROM Complications: None Intrapartum complications/course:  Mom presented to L&D with labor, AROM'd. Precipitously progressed to complete, second stage: 2 minutes.  delivery of fetal head with restitution to ROT.   Anterior then posterior shoulders delivered without difficulty.  Baby placed on mom's chest, and attended to by peds.  We sang happy birthday to baby Knox Royalty.  Cord was then clamped and cut when pulseless.  Placenta spontaneously delivered, intact.   IV pitocin given for hemorrhage prophylaxis. Periurethral tear repaired via mattress suture with 4-0 vicryl. Delivery Type: spontaneous vaginal delivery Anesthesia: none Placenta: spontaneous Laceration:  periurethral Episiotomy: none Newborn Data: Live born female "Declan" Birth Weight: 8 lb 3.6 oz (3730 g) APGAR: 9, 10  Newborn Delivery   Birth date/time: 11/12/2020 18:41:00 Delivery type: Vaginal, Spontaneous      Postpartum Procedures: None Edinburgh:  Edinburgh Postnatal Depression Scale Screening Tool 11/13/2020 11/13/2020  I have been able to laugh and see the funny side of things. 0 (No Data)  I have  looked forward with enjoyment to things. 0 -  I have blamed myself unnecessarily when things went wrong. 0 -  I have been anxious or worried for no good reason. 0 -  I have felt scared or panicky for no good reason. 0 -  Things have been getting on top of me. 0 -  I have been so unhappy that I have had difficulty sleeping. 0 -  I have felt sad or miserable. 0 -  I have been so unhappy that I have been crying. 0 -  The thought of harming myself has occurred to me. 0 -  Edinburgh Postnatal Depression Scale Total 0 -     Post partum course:  Patient had an uncomplicated postpartum course.  By time of discharge on PPD#2, her pain was controlled on oral pain medications; she had appropriate lochia and was ambulating, voiding without difficulty and tolerating regular diet.  She was deemed stable for discharge to home.    Discharge Physical Exam:  BP 108/63 (BP Location: Left Arm)   Pulse 72   Temp 97.9 F (36.6 C) (Oral)   Resp 18   Ht 6' (1.829 m)   Wt 129.3 kg   LMP 02/05/2020   SpO2 98%   Breastfeeding Unknown   BMI 38.65 kg/m   General: NAD CV: RRR Pulm: CTABL, nl effort ABD: s/nd/nt, fundus firm and below the umbilicus Lochia: moderate DVT Evaluation: LE non-ttp, no evidence of DVT on exam.  Hemoglobin  Date Value Ref Range Status  11/13/2020 10.2 (L) 12.0 - 15.0 g/dL Final   HCT  Date Value Ref Range Status  11/13/2020 29.8 (L) 36.0 - 46.0 % Final     Disposition: stable, discharge to home. Baby Feeding: breastmilk Baby  Disposition: home with mom  Rh Immune globulin given: n/a Rubella vaccine given: n/a Flu vaccine given in AP or PP setting: declined  Contraception: TBD  Prenatal Labs:  Blood type/Rh A postive  Antibody screen neg  Rubella Immune  Varicella Immune  RPR NR  HBsAg Neg  HIV NR  GC neg  Chlamydia neg  Genetic screening CfDNA negative  1 hour GTT 123  3 hour GTT N/A  GBS Negative       Plan:  ZAYNA TOSTE was discharged to  home in good condition. Follow-up appointment with Dr. Elesa Massed in 6 weeks.  Discharge Medications: Allergies as of 11/14/2020      Reactions   Sulfa Antibiotics Rash      Medication List    STOP taking these medications   GENERESS FE PO   HYDROcodone-acetaminophen 5-325 MG tablet Commonly known as: NORCO/VICODIN   methocarbamol 500 MG tablet Commonly known as: ROBAXIN   naproxen 500 MG tablet Commonly known as: NAPROSYN   oxyCODONE 5 MG immediate release tablet Commonly known as: Oxy IR/ROXICODONE     TAKE these medications   ferrous sulfate 325 (65 FE) MG tablet Take 1 tablet (325 mg total) by mouth daily with breakfast.   prenatal multivitamin Tabs tablet Take 1 tablet by mouth daily at 12 noon.        Follow-up Information    Ward, Elenora Fender, MD. Schedule an appointment as soon as possible for a visit in 6 week(s).   Specialty: Obstetrics and Gynecology Contact information: [address] [place] [address] [phone]               Signed: Cyril Mourning 11/14/2020 10:20 AM

## 2020-11-12 NOTE — H&P (Signed)
OB History & Physical   History of Present Illness:   Chief Complaint: Contractions  HPI:  Denise Small is a 30 y.o. G2P1001 female at [redacted]w[redacted]d dated by [place] at [redacted]w[redacted]d, not c/w LMP of 02/05/2020.  She presents to L&D for regular contractions since 0130 this morning   Reports active fetal movement  Contractions: every 2 to 4 minutes since 0130 this morning  LOF/SROM: Denies  Vaginal bleeding: bloody show  Pregnancy Issues: 1. Obesity in pregnancy - BMI 37 2. Elevated early 1hr GTT - 3hr GTT WNL, repeat 1hr GTT at 28 weeks WNL 3. Covid-19 infection in pregnancy, resolved - tested positive 05/28/2020  Patient Active Problem List   Diagnosis Date Noted  . Uterine contractions during pregnancy 11/12/2020  . Normal labor 11/12/2020  . Postpartum care following vaginal delivery 05/04/2015  . Elevated blood pressure reading 05/02/2015  . Group B Streptococcus carrier, antepartum 04/16/2015  . Vaginal delivery 11/26/2014     Maternal Medical History:   Past Medical History:  Diagnosis Date  . Headache    Migraines  . SVD (spontaneous vaginal delivery)    x 1    Past Surgical History:  Procedure Laterality Date  . SHOULDER ARTHROSCOPY WITH LABRAL REPAIR Left 02/12/2019   Procedure: left shoulder arthroscopy, labral debridement and biceps tenodesis;  Surgeon: Cammy Copa, MD;  Location: [facility] OR;  Service: Orthopedics;  Laterality: Left;  . SHOULDER SURGERY Left    Labrum repair    Allergies  Allergen Reactions  . Sulfa Antibiotics Rash    Prior to Admission medications   Medication Sig Start Date End Date Taking? Authorizing Provider  Prenatal Vit-Fe Fumarate-FA (PRENATAL MULTIVITAMIN) TABS tablet Take 1 tablet by mouth daily at 12 noon.   Yes [provider]  HYDROcodone-acetaminophen (NORCO/VICODIN) 5-325 MG tablet 1 po q 6hrs prn pain Patient not taking: Reported on 11/12/2020 04/17/19   Magnant, Joycie Peek, PA-C  HYDROcodone-acetaminophen (NORCO/VICODIN) 5-325  MG tablet Take 1 tablet by mouth daily. Patient not taking: Reported on 11/12/2020 04/24/19   Cammy Copa, MD  methocarbamol (ROBAXIN) 500 MG tablet Take 1 tablet (500 mg total) by mouth every 8 (eight) hours as needed for muscle spasms. Patient not taking: Reported on 11/12/2020 05/31/19   Cammy Copa, MD  naproxen (NAPROSYN) 500 MG tablet Take 1 tablet (500 mg total) by mouth 2 (two) times daily with a meal. Patient taking differently: Take 500 mg by mouth 2 (two) times daily as needed (pain).  10/31/18   Darreld Mclean, MD  Norethin-Eth Estradiol-Fe (GENERESS FE PO) Take by mouth 1 day or 1 dose. Patient not taking: Reported on 11/12/2020    [provider]  oxyCODONE (OXY IR/ROXICODONE) 5 MG immediate release tablet 1 po BID prn pain Patient not taking: Reported on 11/12/2020 03/06/19   Cammy Copa, MD     Prenatal care site:  [facility]  Social History: She  reports that she quit smoking about 8 months ago. Her smoking use included cigarettes. She smoked 0.00 packs per day for 0.20 years. She has never used smokeless tobacco. She reports that she does not drink alcohol and does not use drugs.  Family History: family history includes Arthritis in her father and mother.   Review of Systems: A full review of systems was performed and negative except as noted in the HPI.     Physical Exam:  Vital Signs: BP 130/83 (BP Location: Right Arm)   Pulse 72   Temp 97.6  F (36.4 C) (Oral)   Resp 18   Ht 6' (1.829 m)   BMI 33.09 kg/m  Physical Exam  General: no acute distress.  HEENT: normocephalic, atraumatic Heart: regular rate & rhythm.  No murmurs/rubs/gallops Lungs: clear to auscultation bilaterally, normal respiratory effort Abdomen: soft, gravid, non-tender;  EFW: 7 1/2 lbs Pelvic:   External: Normal external female genitalia  Cervix: Dilation: 4 / Effacement (%): 70 / Station: -2 /Soft/Mid  -Previously 3-4/50/-2   Extremities: non-tender,  symmetric, Mild, dependent edema bilaterally.  DTRs: 2+/2+  Neurologic: Alert & oriented x 3.    Results for orders placed or performed during the hospital encounter of 11/12/20 (from the past 24 hour(s))  Resp Panel by RT-PCR (Flu A&B, Covid) Nasopharyngeal Swab     Status: None   Collection Time: 11/12/20 10:44 AM   Specimen: Nasopharyngeal Swab; Nasopharyngeal(NP) swabs in vial transport medium  Result Value Ref Range   SARS Coronavirus 2 by RT PCR NEGATIVE NEGATIVE   Influenza A by PCR NEGATIVE NEGATIVE   Influenza B by PCR NEGATIVE NEGATIVE    Pertinent Results:  Prenatal Labs: Blood type/Rh A postive  Antibody screen neg  Rubella Immune  Varicella Immune  RPR NR  HBsAg Neg  HIV NR  GC neg  Chlamydia neg  Genetic screening CfDNA negative  1 hour GTT 123  3 hour GTT N/A  GBS Negative    FHT: Baseline: 125 bpm, Variability: moderate, Accelerations: present and Decelerations: Absent TOCO: regular, every 2-4 minutes, mild to palpation  SVE:  Dilation: 4 / Effacement (%): 70 / Station: -2    Cephalic by [place] and SVE   No results found.  Assessment:  Denise Small is a 30 y.o. G2P1001 female at [redacted]w[redacted]d with early labor.   Plan:  1. Admit to Labor & Delivery; consents reviewed and obtained - Covid admission screen  - Likely early labor but will admit d/t regular contractions and lives 45+ minutes from the hospital  - Notified Dr. Elesa Massed of admission   2. Fetal Well being  - Fetal Tracing: cat 1 - Group B Streptococcus ppx not indicated: GBS neg - Presentation: cephalic confirmed by SVE   3. Routine OB: - Prenatal labs reviewed, as above - Rh pos - CBC, T&S, RPR on admit - Reg diet, saline lock  4. Monitoring of labor  -  Contractions monitored with external toco -  Pelvis adequate for trial of labor  -  Plan for expectant management  -  Augmentation with AROM and/or oxytocin as appropriate  -  Plan for  intermittent fetal monitoring  -  Maternal pain  control as desired - Anticipate vaginal delivery  5. Post Partum Planning: - Infant feeding: TBD - Contraception: TBD - Tdap vaccine: given 08/17/2020 - Flu vaccine: declined   Gustavo Lah, CNM 11/12/20 12:51 PM  Margaretmary Eddy, CNM Certified Nurse Midwife [facility]

## 2020-11-12 NOTE — Progress Notes (Signed)
Labor Progress Note  Denise Small is a 30 y.o. G2P1001 at [redacted]w[redacted]d by ultrasound admitted for early labor  Subjective: Resting in bed, contractions are still frequent but no change in frequency   Objective: BP 123/72 (BP Location: Right Arm)   Pulse 90   Temp 97.7 F (36.5 C) (Oral)   Resp 20   Ht 6' (1.829 m)   Wt 129.3 kg   BMI 38.65 kg/m  Notable VS details: reviewed   Fetal Assessment: FHT:  FHR: 130 bpm, variability: moderate,  accelerations:  Present,  decelerations:  Absent Category/reactivity:  Category I UC:   regular, every 5-7 minutes SVE:   4-5/70/-2/soft/mid Membrane status: AROM 1708 Amniotic color: Clear  Labs: Lab Results  Component Value Date   WBC 10.2 11/12/2020   HGB 11.4 (L) 11/12/2020   HCT 33.1 (L) 11/12/2020   MCV 79.8 (L) 11/12/2020   PLT 202 11/12/2020    Assessment / Plan: Early labor, augmented with AROM  Labor: Early labor, augmented with AROM Preeclampsia:  no signs or symptoms of toxicity Fetal Wellbeing:  Category I Pain Control:  Labor support without medications I/D:  n/a Anticipated MOD:  NSVD  Gustavo Lah, CNM 11/12/2020, 5:15 PM

## 2020-11-12 NOTE — OB Triage Note (Signed)
Pt is a G1P0 started having contraction at 0135, about 2-4 min apart. Pt reports FM. Pt reports no LOF. Pt reports some spotting since membrane strip on 11/10/20. Pt reports no N/V/D. Pt reports no headaches or blurred vision.

## 2020-11-13 LAB — CBC
HCT: 29.8 % — ABNORMAL LOW (ref 36.0–46.0)
Hemoglobin: 10.2 g/dL — ABNORMAL LOW (ref 12.0–15.0)
MCH: 27.3 pg (ref 26.0–34.0)
MCHC: 34.2 g/dL (ref 30.0–36.0)
MCV: 79.7 fL — ABNORMAL LOW (ref 80.0–100.0)
Platelets: 187 10*3/uL (ref 150–400)
RBC: 3.74 MIL/uL — ABNORMAL LOW (ref 3.87–5.11)
RDW: 13.3 % (ref 11.5–15.5)
WBC: 10.1 10*3/uL (ref 4.0–10.5)
nRBC: 0 % (ref 0.0–0.2)

## 2020-11-13 LAB — RPR: RPR Ser Ql: NONREACTIVE

## 2020-11-13 MED ORDER — IBUPROFEN 600 MG PO TABS
600.0000 mg | ORAL_TABLET | Freq: Four times a day (QID) | ORAL | Status: DC
  Administered 2020-11-13: 600 mg via ORAL
  Filled 2020-11-13 (×2): qty 1

## 2020-11-13 MED ORDER — FERROUS SULFATE 325 (65 FE) MG PO TABS
325.0000 mg | ORAL_TABLET | Freq: Every day | ORAL | Status: DC
  Administered 2020-11-14: 325 mg via ORAL
  Filled 2020-11-13: qty 1

## 2020-11-13 NOTE — Lactation Note (Signed)
This note was copied from a baby's chart. Lactation Consultation Note  Patient Name: Denise Small [id number] Date: 11/13/2020 Reason for consult: Initial assessment;Term Age:28 hours  Maternal Data Does the patient have breastfeeding experience prior to this delivery?: Yes How long did the patient breastfeed?: 3 mths  Feeding Mother's Current Feeding Choice: Breast Milk Baby was asleep and mom stated had just finished feeding when LC came in, states she hears swallows when feeding, states sometimes baby doesn't open wide and doesn't get a deep latch, but fed well and latched well just after delivery, has been spitting some mucous and gaggy since at times, just had first bath also.   LATCH Score                    Lactation Tools Discussed/Used    Interventions  LC name and no written on white board, encouraged to call LC at next feeding to assess latch and as needed for questions or concerns. Discharge    Consult Status Consult Status: Follow-up Date: 11/13/20 Follow-up type: In-patient    Dyann Kief 11/13/2020, 10:49 AM

## 2020-11-13 NOTE — Lactation Note (Signed)
This note was copied from a baby's chart. Lactation Consultation Note  Patient Name: Denise Small [id number] Date: 11/13/2020 Reason for consult: Follow-up assessment Age:28 hours  Maternal Data    Feeding Mother's Current Feeding Choice: Breast Milk Mom had recently fed and baby was asleep so I did not observe a feeding, mom's nipples sl tender, reviewed tips to decrease soreness, coconut oil given with instruction in use.   LATCH Score                    Lactation Tools Discussed/Used    Interventions Interventions: Coconut oil  Discharge Pump: Personal WIC Program: No  Consult Status Consult Status: PRN Date: 11/13/20 Follow-up type: In-patient    Dyann Kief 11/13/2020, 6:19 PM

## 2020-11-13 NOTE — Progress Notes (Signed)
Post Partum Day 1  Subjective: Doing well, no concerns. Ambulating without difficulty, pain managed with PO meds, tolerating regular diet, and voiding without difficulty.   No fever/chills, chest pain, shortness of breath, nausea/vomiting, or leg pain. No nipple or breast pain.   Objective: BP 102/67 (BP Location: Left Arm)   Pulse 76   Temp (!) 97.5 F (36.4 C) (Oral)   Resp 16   Ht 6' (1.829 m)   Wt 129.3 kg   LMP 02/05/2020   SpO2 98%   Breastfeeding Unknown   BMI 38.65 kg/m    Physical Exam:  General: alert and cooperative Breasts: soft/nontender CV: RRR Pulm: nl effort Abdomen: soft, non-tender Uterine Fundus: firm Incision: n/a Perineum: minimal edema, repair well approximated Lochia: appropriate DVT Evaluation: No evidence of DVT seen on physical exam. [place]: No flowsheet data found.   Recent Labs    11/12/20 1335 11/13/20 0439  HGB 11.4* 10.2*  HCT 33.1* 29.8*  WBC 10.2 10.1  PLT 202 187    Assessment/Plan: 30 y.o. G2P2002 postpartum day # 1  -Continue routine postpartum care -Lactation consult PRN for breastfeeding   -Acute blood loss anemia - hemodynamically stable and asymptomatic; start PO ferrous sulfate BID with stool softeners  -Immunization status: Needs flu prior to discharge  Disposition: Continue inpatient postpartum care. Peds would like to keep baby until tomorrow to allow mom to work on breastfeeding and to do circ tomorrow.   LOS: 1 day   JENNIFER OXLEY, CNM 11/13/2020, 8:26 AM

## 2020-11-14 DIAGNOSIS — D62 Acute posthemorrhagic anemia: Secondary | ICD-10-CM | POA: Diagnosis not present

## 2020-11-14 MED ORDER — FERROUS SULFATE 325 (65 FE) MG PO TABS: 325.0000 mg | ORAL_TABLET | Freq: Every day | ORAL | Status: AC

## 2020-11-14 MED ORDER — FERROUS SULFATE 325 (65 FE) MG PO TABS: 325.0000 mg | ORAL_TABLET | Freq: Two times a day (BID) | ORAL | Status: DC

## 2020-11-14 NOTE — Lactation Note (Signed)
This note was copied from a baby's chart. Lactation Consultation Note  Patient Name: Denise Small VCBSW'H Date: 11/14/2020 Reason for consult: Follow-up assessment Age:28 hours  Lactation Rounds: LC to the room for a visit. Mother is holding baby in cradle on the right. Baby ios asleep at the breast. Mother stated he has been cluster feeding. Mother states feeds are going well. LC reviewed and encouraged feeding on demand and with cues. Reviewed diaper counts for days of life. Reviewed INJOY booklet "understanding Postpartum and Newborn care " at bedside. Reviewed outpatient Lactation number and resources. [email or web address] and [email or web address] resources, encouraged downloading an app for tracking feeds and diapers.  Mother stated understanding with all teaching and no needs at this time.  LC # on her board.   Maternal Data Has patient been taught Hand Expression?: Yes (stated she is aware how to HE) Does the patient have breastfeeding experience prior to this delivery?: Yes How long did the patient breastfeed?: 3 months  Feeding Mother's Current Feeding Choice: Breast Milk    Interventions Interventions: Coconut oil  Discharge Discharge Education: Warning signs for feeding baby Pump: Personal (has a Medela and a Spectra at home)  Consult Status Consult Status: Complete Follow-up type: Call as needed    April D Ruhl 11/14/2020, 9:01 AM

## 2020-11-14 NOTE — Progress Notes (Signed)
Pt discharged with infant.  Discharge instructions, prescriptions and follow up appointment given to and reviewed with pt. Pt verbalized understanding. Escorted out by auxillary. 

## 2020-11-14 NOTE — Discharge Instructions (Signed)
Postpartum Care After Vaginal Delivery The following information offers guidance about how to care for yourself from the time you deliver your baby to 6-12 weeks after delivery (postpartum period). If you have problems or questions, contact your health care provider for more specific instructions. Follow these instructions at home: Vaginal bleeding  It is normal to have vaginal bleeding (lochia) after delivery. Wear a sanitary pad for bleeding and discharge. ? During the first week after delivery, the amount and appearance of lochia is often similar to a menstrual period. ? Over the next few weeks, it will gradually decrease to a dry, yellow-brown discharge. ? For most women, lochia stops completely by 4-6 weeks after delivery, but can vary.  Change your sanitary pads frequently. Watch for any changes in your flow, such as: ? A sudden increase in volume. ? A change in color. ? Large blood clots.  If you pass a blood clot from your vagina, save it and call your health care provider. Do not flush blood clots down the toilet before talking with your health care provider.  Do not use tampons or douches until your health care provider approves.  If you are not breastfeeding, your period should return 6-8 weeks after delivery. If you are feeding your baby breast milk only, your period may not return until you stop breastfeeding. Perineal care  Keep the area between the vagina and the anus (perineum) clean and dry. Use medicated pads and pain-relieving sprays and creams as directed.  If you had a surgical cut in the perineum (episiotomy) or a tear, check the area for signs of infection until you are healed. Check for: ? More redness, swelling, or pain. ? Fluid or blood coming from the cut or tear. ? Warmth. ? Pus or a bad smell.  You may be given a squirt bottle to use instead of wiping to clean the perineum area after you use the bathroom. Pat the area gently to dry it.  To relieve pain  caused by an episiotomy, a tear, or swollen veins in the anus (hemorrhoids), take a warm sitz bath 2-3 times a day. In a sitz bath, the warm water should only come up to your hips and cover your buttocks.   Breast care  In the first few days after delivery, your breasts may feel heavy, full, and uncomfortable (breast engorgement). Milk may also leak from your breasts. Ask your health care provider about ways to help relieve the discomfort.  If you are breastfeeding: ? Wear a bra that supports your breasts and fits well. Use breast pads to absorb milk that leaks. ? Keep your nipples clean and dry. Apply creams and ointments as told. ? You may have uterine contractions every time you breastfeed for up to several weeks after delivery. This helps your uterus return to its normal size. ? If you have any problems with breastfeeding, notify your health care provider or lactation consultant.  If you are not breastfeeding: ? Avoid touching your breasts. Do not squeeze out (express) milk. Doing this can make your breasts produce more milk. ? Wear a good-fitting bra and use cold packs to help with swelling. Intimacy and sexuality  Ask your health care provider when you can engage in sexual activity. This may depend upon: ? Your risk of infection. ? How fast you are healing. ? Your comfort and desire to engage in sexual activity.  You are able to get pregnant after delivery, even if you have not had your period. Talk with   your health care provider about methods of birth control (contraception) or family planning if you desire future pregnancies. Medicines  Take over-the-counter and prescription medicines only as told by your health care provider.  Take an over-the-counter stool softener to help ease bowel movements as told by your health care provider.  If you were prescribed an antibiotic medicine, take it as told by your health care provider. Do not stop taking the antibiotic even if you start to  feel better.  Review all previous and current prescriptions to check for possible transfer into breast milk. Activity  Gradually return to your normal activities as told by your health care provider.  Rest as much as possible. Nap while your baby is sleeping. Eating and drinking  Drink enough fluid to keep your urine pale yellow.  To help prevent or relieve constipation, eat high-fiber foods every day.  Choose healthy eating to support breastfeeding or weight loss goals.  Take your prenatal vitamins until your health care provider tells you to stop.   General tips/recommendations  Do not use any products that contain nicotine or tobacco. These products include cigarettes, chewing tobacco, and vaping devices, such as e-cigarettes. If you need help quitting, ask your health care provider.  Do not drink alcohol, especially if you are breastfeeding.  Do not take medications or drugs that are not prescribed to you, especially if you are breastfeeding.  Visit your health care provider for a postpartum checkup within the first 3-6 weeks after delivery.  Complete a comprehensive postpartum visit no later than 12 weeks after delivery.  Keep all follow-up visits for you and your baby. Contact a health care provider if:  You feel unusually sad or worried.  Your breasts become red, painful, or hard.  You have a fever or other signs of an infection.  You have bleeding that is soaking through one pad an hour or you have blood clots.  You have a severe headache that doesn't go away or you have vision changes.  You have nausea and vomiting and are unable to eat or drink anything for 24 hours. Get help right away if:  You have chest pain or difficulty breathing.  You have sudden, severe leg pain.  You faint or have a seizure.  You have thoughts about hurting yourself or your baby. If you ever feel like you may hurt yourself or others, or have thoughts about taking your own life,  get help right away. Go to your nearest emergency department or:  Call your local emergency services (911 in the [place]  The National Suicide Prevention Lifeline at 1-800-273-8255. This suicide crisis helpline is open 24 hours a day.  Text the Crisis Text Line at 741741 (in the [place] Summary  The period of time after you deliver your newborn up to 6-12 weeks after delivery is called the postpartum period.  Keep all follow-up visits for you and your baby.  Review all previous and current prescriptions to check for possible transfer into breast milk.  Contact a health care provider if you feel unusually sad or worried during the postpartum period. This information is not intended to replace advice given to you by your health care provider. Make sure you discuss any questions you have with your health care provider. Document Revised: 05/21/2020 Document Reviewed: 05/21/2020 Elsevier Patient Education  2021 [employer] Breastfeeding Tips for a Good Latch Latching is how your baby's mouth attaches to your nipple to breastfeed. It is an important part of breastfeeding. Your baby   may have trouble latching for a number of reasons, such as:  Not being in the right position.  Using a bottle or pacifier too early.  Problems within your baby's mouth, tongue, or lips.  The shape of your nipples.  Your baby being born early (prematurely). Small babies often have a weak suck.  Breasts becoming overfilled with milk (engorged breasts).  Express a little milk to help soften the breast. Work with a breastfeeding specialist (lactation consultant) to help your baby have a good latch. How does this affect me? A poor latch may cause you to have problems such as:  Cracked nipples.  Sore nipples.  Breasts becoming overfilled with milk  Plugged milk ducts.  Low milk supply.  Breast inflammation.  Breast infection. How does this affect my baby? A poor latch may cause your baby to not be  able to feed well. As a result, he or she may have trouble gaining weight. Follow these instructions at home: How to position your baby  Find a comfortable place to sit or lie down. Your neck and back should be well supported.  If you are seated, place a pillow or rolled-up blanket under your baby. This will bring him or her to the level of your breast.  Make sure that your baby's belly is facing your belly.  Try different positions to find one that works best for you and your baby. How to help your baby latch  To start, you might find it helpful to gently rub your breast. Move your fingertips in a circle as you massage from your chest wall toward your nipple. This helps milk flow. Keep doing this during feeding if needed.  Position your breast. Hold your breast with four fingers underneath and your thumb above your nipple. Keep your fingers away from your nipple and your baby's mouth. Follow these steps to help your baby latch: 1. Rub your baby's lips gently with your finger or nipple. 2. When your baby's mouth is open wide enough, quickly bring your baby to your breast and place your whole nipple into your baby's mouth. Place as much of the colored area around your nipple (areola)as possible into your baby's mouth. 3. Your baby's tongue should be between his or her lower gum and your breast. 4. You should be able to see more areola above your baby's upper lip than below the lower lip. 5. When your baby starts sucking, you will feel a gentle pull on your nipple. You should not feel any pain. Be patient. It is common for a baby to suck for about 2-3 minutes to start the flow of breast milk. 6. Make sure that your baby's mouth is in the right position around your nipple. Your baby's lips should make a seal on your breast and be turned outward.   General instructions  Look for these signs that your baby has latched on to your nipple: ? The baby is quietly tugging or sucking without causing  you pain. ? You hear the baby swallow after every 3 or 4 sucks. ? You see movement above and in front of the baby's ears while he or she is sucking.  Be aware of these signs that your baby has not latched on to your nipple: ? The baby makes sucking sounds or smacking sounds while feeding. ? You have nipple pain.  If your baby is not latched well, put your little finger between your baby's gums and your nipple. This will break the seal. Then try   to help your baby latch again.  If you need help, get help from a breastfeeding specialist. Contact a doctor if:  You have cracking or soreness in your nipples that lasts longer than 1 week.  You have nipple pain.  Your breasts are filled with too much milk (engorgement), and this does not improve after 48-72 hours.  You have a plugged milk duct and a fever.  You follow the tips for a good latch but need more help.  You have a pus-like fluid coming from your breast.  Your baby is not gaining weight.  Your baby loses weight. Summary  Latching is how your baby's mouth attaches to your nipple to breastfeed.  Try different positions for breastfeeding to find one that works best for you and your baby.  A poor latch may cause you to have cracked or sore nipples or other problems.  Work with a breastfeeding specialist (lactation consultant) to help your baby have a good latch. This information is not intended to replace advice given to you by your health care provider. Make sure you discuss any questions you have with your health care provider. Document Revised: 03/04/2020 Document Reviewed: 03/04/2020 Elsevier Patient Education  2021 [employer]  

## 2021-04-26 ENCOUNTER — Ambulatory Visit: Payer: Medicaid Other | Admitting: Dermatology

## 2022-03-11 ENCOUNTER — Other Ambulatory Visit: Payer: Self-pay

## 2022-03-11 ENCOUNTER — Encounter (HOSPITAL_COMMUNITY): Payer: Self-pay

## 2022-03-11 ENCOUNTER — Emergency Department (HOSPITAL_COMMUNITY): Payer: Medicaid Other

## 2022-03-11 ENCOUNTER — Emergency Department (HOSPITAL_COMMUNITY)
Admission: EM | Admit: 2022-03-11 | Discharge: 2022-03-11 | Disposition: A | Discharge status: Home / Self Care | Payer: Medicaid Other | Attending: Emergency Medicine | Admitting: Emergency Medicine

## 2022-03-11 DIAGNOSIS — M778 Other enthesopathies, not elsewhere classified: Secondary | ICD-10-CM | POA: Insufficient documentation

## 2022-03-11 DIAGNOSIS — M25511 Pain in right shoulder: Secondary | ICD-10-CM | POA: Diagnosis present

## 2022-03-11 MED ORDER — NAPROXEN 500 MG PO TABS: 500.0000 mg | ORAL_TABLET | Freq: Two times a day (BID) | ORAL | 0 refills | Status: AC

## 2022-03-11 MED ORDER — ACETAMINOPHEN 500 MG PO TABS: 500.0000 mg | ORAL_TABLET | Freq: Four times a day (QID) | ORAL | 0 refills | Status: AC | PRN

## 2022-03-11 MED ORDER — HYDROCODONE-ACETAMINOPHEN 5-325 MG PO TABS
1.0000 | ORAL_TABLET | Freq: Once | ORAL | Status: AC
  Administered 2022-03-11: 1 via ORAL
  Filled 2022-03-11: qty 1

## 2022-03-11 NOTE — ED Notes (Signed)
Patient transported to X-ray
